# Patient Record
Sex: Male | Born: 1950 | Race: White | Hispanic: No | Marital: Married | Smoking: Never smoker
Health system: Southern US, Community
[De-identification: ages and names within clinical notes are randomized; demographics above are authoritative.]

## PROBLEM LIST (undated history)

## (undated) DIAGNOSIS — I10 Essential (primary) hypertension: Secondary | ICD-10-CM

## (undated) DIAGNOSIS — N200 Calculus of kidney: Secondary | ICD-10-CM

## (undated) DIAGNOSIS — R011 Cardiac murmur, unspecified: Secondary | ICD-10-CM

## (undated) DIAGNOSIS — E785 Hyperlipidemia, unspecified: Secondary | ICD-10-CM

## (undated) DIAGNOSIS — B019 Varicella without complication: Secondary | ICD-10-CM

## (undated) HISTORY — DX: Hyperlipidemia, unspecified: E78.5

## (undated) HISTORY — DX: Cardiac murmur, unspecified: R01.1

## (undated) HISTORY — PX: TONSILLECTOMY: SHX5217

## (undated) HISTORY — DX: Essential (primary) hypertension: I10

## (undated) HISTORY — DX: Calculus of kidney: N20.0

## (undated) HISTORY — DX: Varicella without complication: B01.9

---

## 2009-06-15 ENCOUNTER — Emergency Department (HOSPITAL_COMMUNITY): Admission: EM | Admit: 2009-06-15 | Discharge: 2009-06-15 | Payer: Self-pay | Admitting: Emergency Medicine

## 2010-03-19 IMAGING — CT CT ABDOMEN W/O CM
2 of 4 series · 16 of 46 positions shown, 18 images · non-contrast
Comparison: None.

CT ABDOMEN

CLINICAL DATA: Lower left abdominal and pelvic flank pain .

CT ABDOMEN AND PELVIS WITHOUT CONTRAST
TECHNIQUE: Multidetector CT imaging of the abdomen and pelvis was
performed following the standard protocol without intravenous
contrast.

[Series 2: stone <45 and or <(id) 5.0 mm · axial · 0.66mm/px · z∈[-158,+247]mm · 13 of 89 slices shown, 15 images]
[im 4/89  soft-tissue]
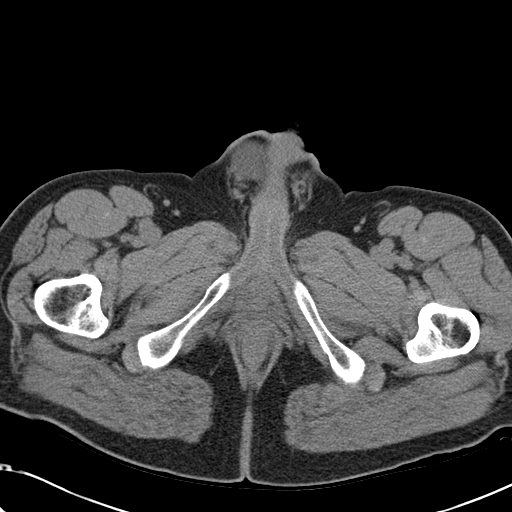
[im 4/89  bone]
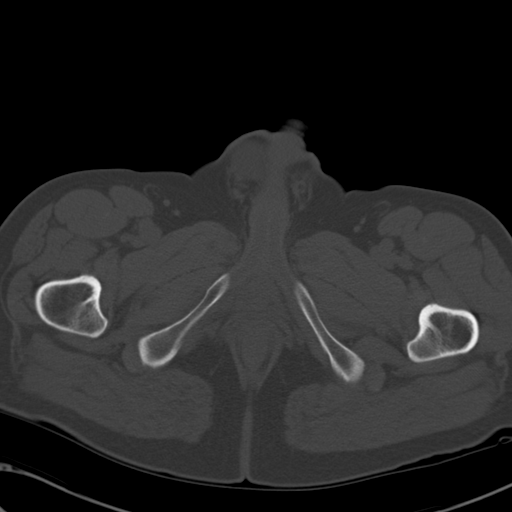
[im 11/89  soft-tissue]
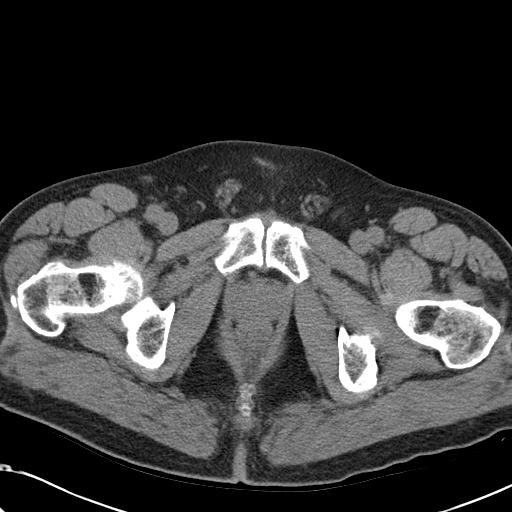
[im 17/89  soft-tissue]
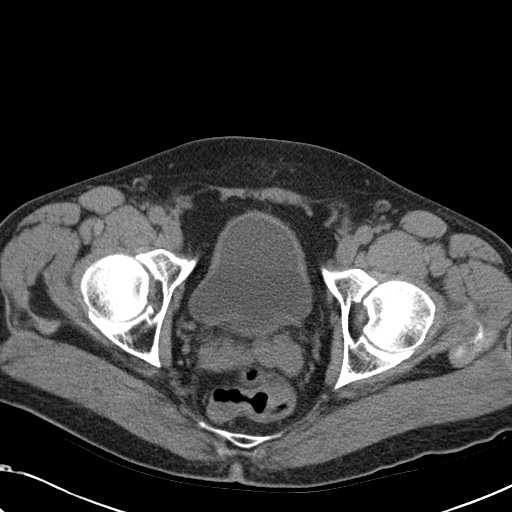
[im 24/89  soft-tissue]
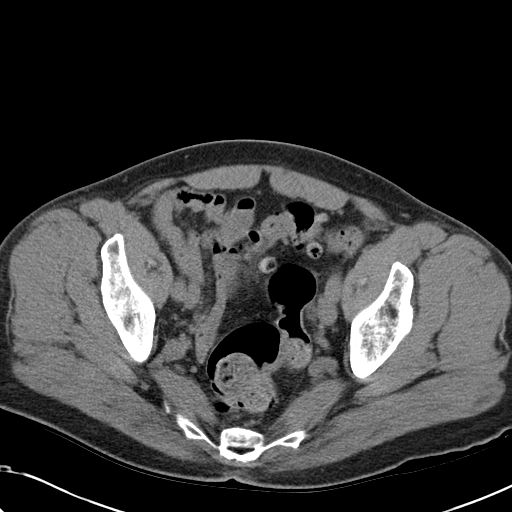
[im 31/89  soft-tissue]
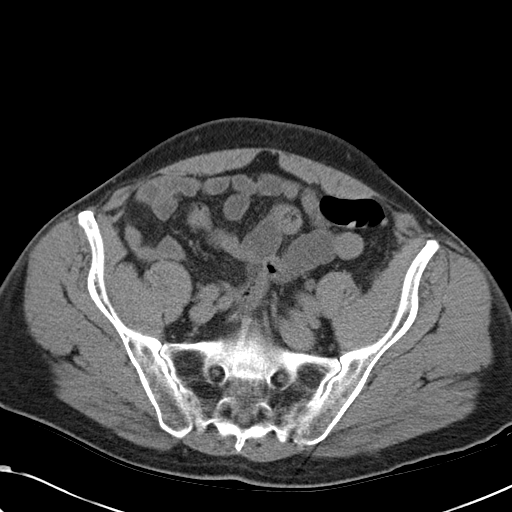
[im 38/89  soft-tissue]
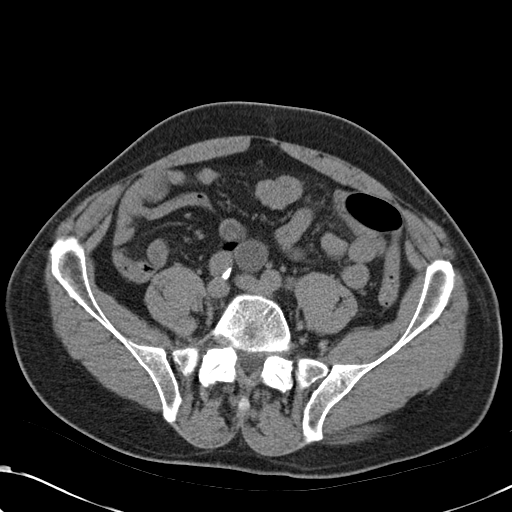
[im 45/89  soft-tissue]
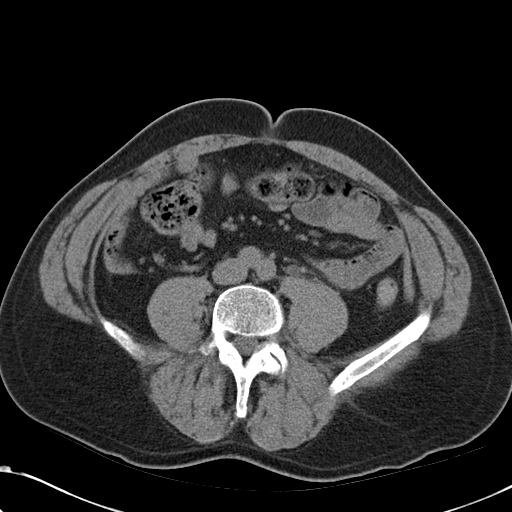
[im 51/89  soft-tissue]
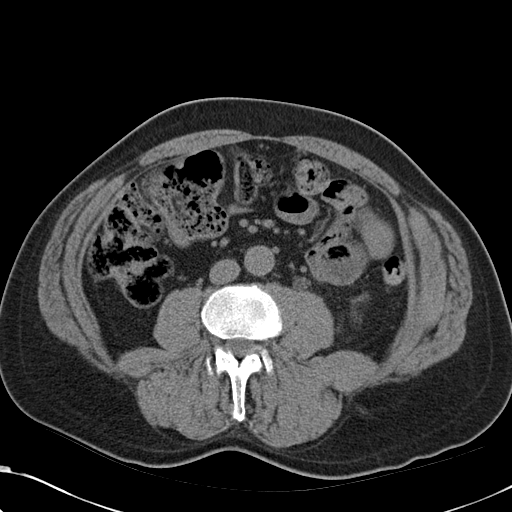
[im 58/89  soft-tissue]
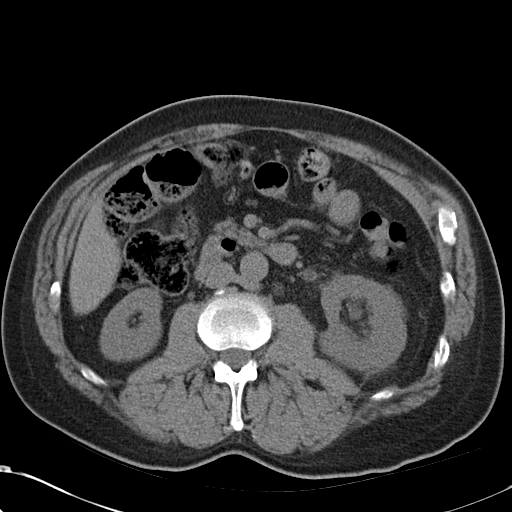
[im 58/89  bone]
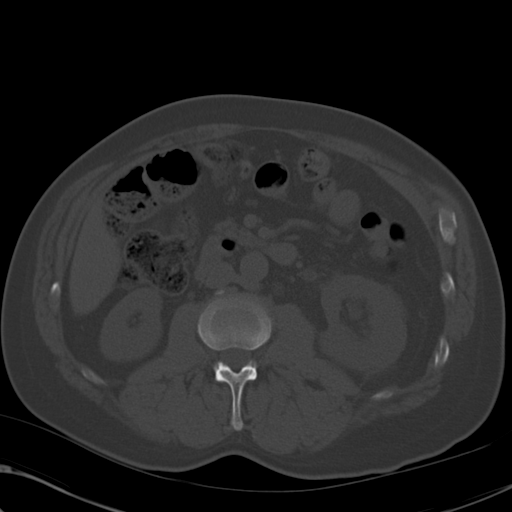
[im 65/89  soft-tissue]
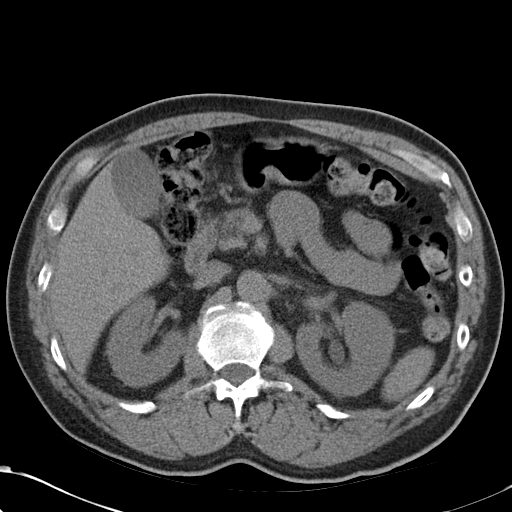
[im 72/89  soft-tissue]
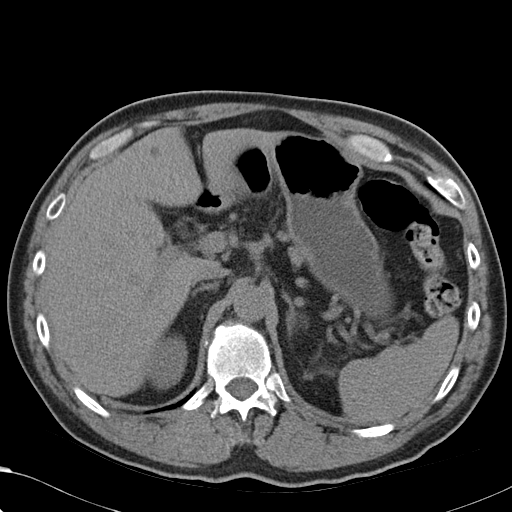
[im 78/89  soft-tissue]
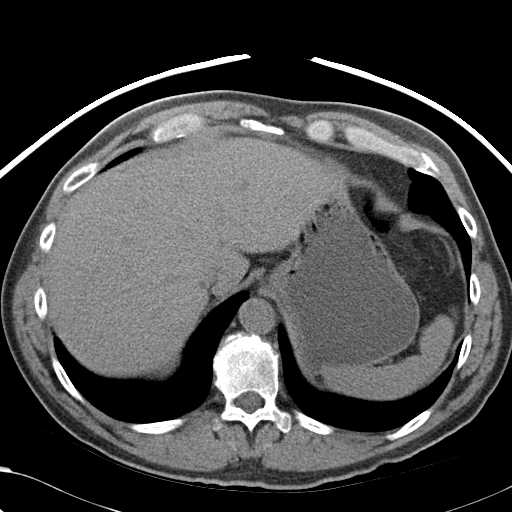
[im 85/89  soft-tissue]
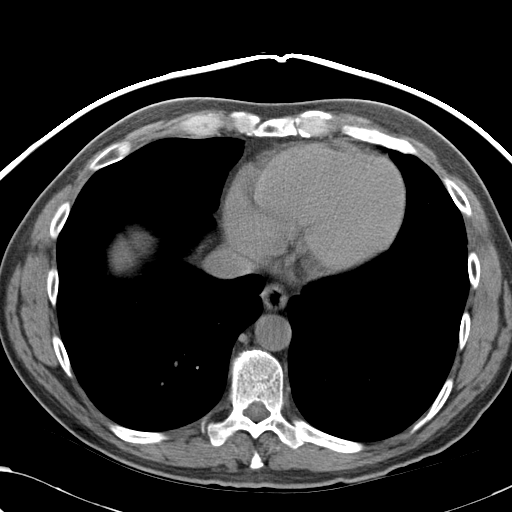

[Series 4: mpr coronal · coronal · 0.65mm/px · 3 of 80 slices shown]
[im 27/80  soft-tissue]
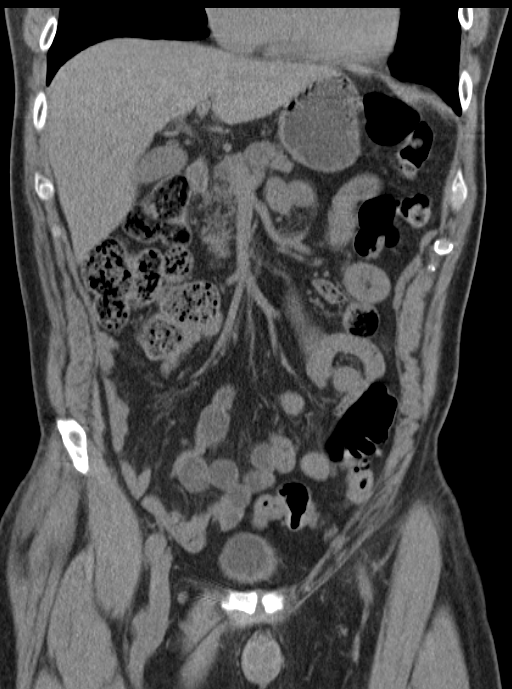
[im 36/80  soft-tissue]
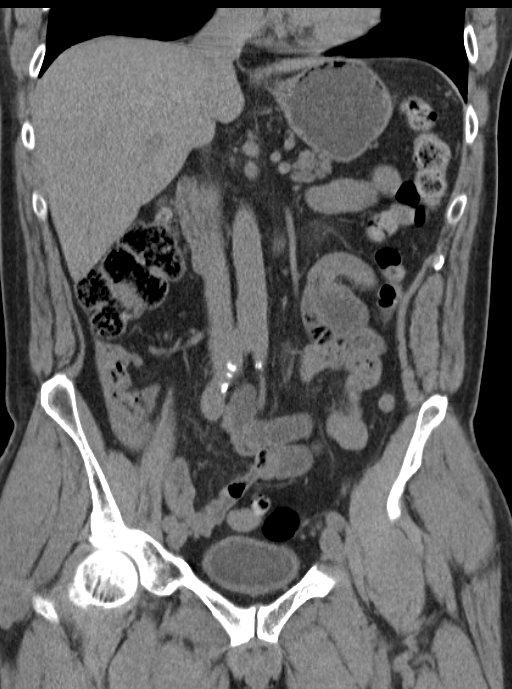
[im 44/80  soft-tissue]
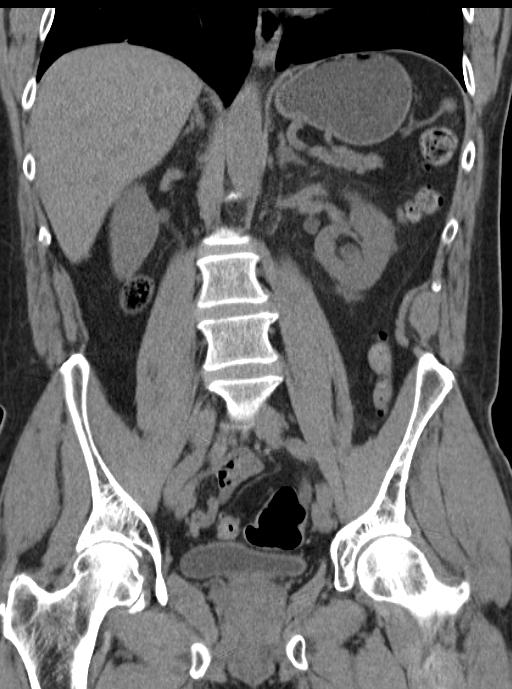

[16 of 46 positions shown; findings below may reference images not displayed]

FINDINGS: Lung bases are clear.  No pericardial or pleural
effusion.  No hiatal hernia.

In the abdomen, the left kidney demonstrates mild acute perinephric
inflammation and hydronephrosis.  Left ureter is also dilated and
inflamed.  This is secondary to a distal left pelvic ureteral
calculus described below.  Right kidney demonstrates no acute
process.  No additional upper urinary tract calculi in the abdomen.

Liver demonstrates a 7 mm focal hypodensity image 18 in the left
hepatic lobe medial segment, nonspecific by noncontrast imaging but
suspect small hepatic cyst.  No intrahepatic biliary dilatation.
The gallbladder, biliary system, adrenal glands, spleen, and
pancreas are within normal limits for noncontrast study.  No acute
bowel obstruction, dilatation, or ileus.  Diverticulosis of the
colon.  No free air
IMPRESSION: Acute mild left hydronephrosis and hydroureter related to a distal
pelvic left ureteral calculus.
Probable small sub centimeter left hepatic cyst
Diverticulosis

CT PELVIS
FINDINGS: Left distal ureter remains dilated as it extends to the
pelvis.  In the left hemi pelvis, there is an obstructing left
ureteral calculus measuring 4.6 mm, image 70.  This is just above
the left UVJ.  Right distal ureter is decompressed and normal.

Diverticulosis also noted of the distal colon.  Terminal ileum and
appendix are normal in the right lower quadrant.  Minimal
atherosclerosis of the iliac vessels.  No inguinal hernia.  Right
hydrocele is noted which is partially imaged.
IMPRESSION: 4.6 mm left distal ureteral obstructing calculus.
Diverticulosis
Right scrotal hydrocele, partially imaged

## 2011-02-28 LAB — URINALYSIS, ROUTINE W REFLEX MICROSCOPIC
Bilirubin Urine: NEGATIVE
Leukocytes, UA: NEGATIVE
Nitrite: NEGATIVE
Specific Gravity, Urine: >1.03 — ABNORMAL HIGH (ref 1.005–1.030)
Urobilinogen, UA: 0.2 mg/dL (ref 0.0–1.0)
pH: 5.5 (ref 5.0–8.0)

## 2011-02-28 LAB — URINE MICROSCOPIC-ADD ON

## 2012-07-07 ENCOUNTER — Encounter: Payer: Self-pay | Admitting: Family Medicine

## 2012-07-07 ENCOUNTER — Ambulatory Visit (INDEPENDENT_AMBULATORY_CARE_PROVIDER_SITE_OTHER): Payer: BC Managed Care – PPO | Admitting: Family Medicine

## 2012-07-07 VITALS — BP 140/80 | Temp 98.2°F | Ht 69.5 in | Wt 188.0 lb

## 2012-07-07 DIAGNOSIS — Z0000 Encounter for general adult medical examination without abnormal findings: Secondary | ICD-10-CM

## 2012-07-07 DIAGNOSIS — Z23 Encounter for immunization: Secondary | ICD-10-CM

## 2012-07-07 LAB — CBC WITH DIFFERENTIAL/PLATELET
Eosinophils Relative: 1.7 % (ref 0.0–5.0)
HCT: 44 % (ref 39.0–52.0)
Hemoglobin: 14.8 g/dL (ref 13.0–17.0)
Lymphs Abs: 1.5 10*3/uL (ref 0.7–4.0)
MCV: 91.3 fl (ref 78.0–100.0)
Monocytes Absolute: 0.5 10*3/uL (ref 0.1–1.0)
Monocytes Relative: 7.6 % (ref 3.0–12.0)
Neutro Abs: 4 10*3/uL (ref 1.4–7.7)
RDW: 13.5 % (ref 11.5–14.6)
WBC: 6.1 10*3/uL (ref 4.5–10.5)

## 2012-07-07 LAB — BASIC METABOLIC PANEL
CO2: 27 mEq/L (ref 19–32)
Calcium: 9.1 mg/dL (ref 8.4–10.5)
Creatinine, Ser: 0.9 mg/dL (ref 0.4–1.5)
GFR: 89.3 mL/min (ref >60.00)
Sodium: 138 mEq/L (ref 135–145)

## 2012-07-07 LAB — LIPID PANEL
HDL: 54.6 mg/dL (ref >39.00)
Total CHOL/HDL Ratio: 4
Triglycerides: 116 mg/dL (ref 0.0–149.0)

## 2012-07-07 LAB — POCT URINALYSIS DIPSTICK
Blood, UA: NEGATIVE
Nitrite, UA: NEGATIVE
Protein, UA: NEGATIVE
Urobilinogen, UA: 0.2
pH, UA: 6

## 2012-07-07 LAB — HEPATIC FUNCTION PANEL
ALT: 20 U/L (ref 0–53)
AST: 19 U/L (ref 0–37)
Albumin: 4.3 g/dL (ref 3.5–5.2)
Alkaline Phosphatase: 49 U/L (ref 39–117)
Total Protein: 6.8 g/dL (ref 6.0–8.3)

## 2012-07-07 NOTE — Progress Notes (Signed)
  Subjective:    Patient ID: Harold White, male    DOB: 12-Feb-1951, 61 y.o.   MRN: [id number]  HPI  Here to establish care. Patient requesting physical. Past history reviewed. Borderline elevated blood pressure. Never treated. Reported history of heart murmur in childhood. One time history of kidney stone. Previous tonsillectomy. Takes no medications other than baby aspirin.  Family history significant for mother with stroke. Dad is 33 and healthy.  Patient is married. No children. Construction supervision. Smoked only briefly in the 1970s. Drinks 2-3 alcoholic beverages per day, usually beer.   darkened spot dorsum left distal middle finger. Present for several years. Unchanged. No history of leg injury.  Fullness under both arms. By description this is fatty tissue and not lymphadenopathy. No appetite or weight changes.  Last tetanus over 10 years ago. No history of screening colonoscopy. No consistent exercise.   Review of Systems  Constitutional: Negative for fever, activity change, appetite change and fatigue.  HENT: Negative for ear pain, congestion and trouble swallowing.   Eyes: Negative for pain and visual disturbance.  Respiratory: Negative for cough, shortness of breath and wheezing.   Cardiovascular: Negative for chest pain and palpitations.  Gastrointestinal: Negative for nausea, vomiting, abdominal pain, diarrhea, constipation, blood in stool, abdominal distention and rectal pain.  Genitourinary: Negative for dysuria, hematuria and testicular pain.  Musculoskeletal: Negative for joint swelling and arthralgias.  Skin: Negative for rash.  Neurological: Negative for dizziness, syncope and headaches.  Hematological: Negative for adenopathy.  Psychiatric/Behavioral: Negative for confusion and dysphoric mood.       Objective:   Physical Exam  Constitutional: He is oriented to person, place, and time. He appears well-developed and well-nourished. No distress.  HENT:    Head: Normocephalic and atraumatic.  Right Ear: External ear normal.  Left Ear: External ear normal.  Mouth/Throat: Oropharynx is clear and moist.  Eyes: Conjunctivae and EOM are normal. Pupils are equal, round, and reactive to light.  Neck: Normal range of motion. Neck supple. No thyromegaly present.  Cardiovascular: Normal rate, regular rhythm and normal heart sounds.   No murmur heard. Pulmonary/Chest: No respiratory distress. He has no wheezes. He has no rales.  Abdominal: Soft. Bowel sounds are normal. He exhibits no distension and no mass. There is no tenderness. There is no rebound and no guarding.  Musculoskeletal: He exhibits no edema.  Lymphadenopathy:    He has no cervical adenopathy.  Neurological: He is alert and oriented to person, place, and time. He displays normal reflexes. No cranial nerve deficit.  Skin: No rash noted.       Left middle finger between PIP and DIP joint small darkened area below the epidermis. Approximately 1 mm diameter. No rash  Psychiatric: He has a normal mood and affect.          Assessment & Plan:  #1 health maintenance. Tetanus booster given. Hemoccult cards given. Patient undecided regarding colonoscopy this time. Establish more consistent exercise. Obtain screening lab work #2 darkened spot left middle finger. This appears to be probably lead deposit. This is not epidermal. Reassurance  #3 fatty tissue axillary and chest wall region. No evidence for adenopathy. This is benign fatty tissue. Reassurance

## 2012-07-07 NOTE — Patient Instructions (Addendum)
Consider colonoscopy at some point this year. 

## 2012-07-12 NOTE — Progress Notes (Signed)
Quick Note:  Called and spoke with pt and pt is aware of lab results. Verfied address and labs sent. ______

## 2013-07-13 ENCOUNTER — Encounter: Payer: BC Managed Care – PPO | Admitting: Family Medicine

## 2013-07-13 ENCOUNTER — Other Ambulatory Visit (INDEPENDENT_AMBULATORY_CARE_PROVIDER_SITE_OTHER): Payer: BC Managed Care – PPO

## 2013-07-13 DIAGNOSIS — Z0000 Encounter for general adult medical examination without abnormal findings: Secondary | ICD-10-CM

## 2013-07-13 LAB — POCT URINALYSIS DIPSTICK
Glucose, UA: NEGATIVE
Ketones, UA: NEGATIVE
Protein, UA: NEGATIVE
Spec Grav, UA: 1.01

## 2013-07-13 LAB — CBC WITH DIFFERENTIAL/PLATELET
Basophils Absolute: 0 10*3/uL (ref 0.0–0.1)
Basophils Relative: 0.5 % (ref 0.0–3.0)
Eosinophils Absolute: 0.1 10*3/uL (ref 0.0–0.7)
MCHC: 34.4 g/dL (ref 30.0–36.0)
MCV: 89.5 fl (ref 78.0–100.0)
Monocytes Absolute: 0.4 10*3/uL (ref 0.1–1.0)
Neutrophils Relative %: 48.5 % (ref 43.0–77.0)
Platelets: 126 10*3/uL — ABNORMAL LOW (ref 150.0–400.0)
RBC: 4.8 Mil/uL (ref 4.22–5.81)
RDW: 13.9 % (ref 11.5–14.6)

## 2013-07-13 LAB — LIPID PANEL
Cholesterol: 198 mg/dL (ref 0–200)
VLDL: 13.8 mg/dL (ref 0.0–40.0)

## 2013-07-13 LAB — PSA: PSA: 0.48 ng/mL (ref 0.10–4.00)

## 2013-07-13 LAB — HEPATIC FUNCTION PANEL
Alkaline Phosphatase: 42 U/L (ref 39–117)
Bilirubin, Direct: 0.1 mg/dL (ref 0.0–0.3)

## 2013-07-13 LAB — BASIC METABOLIC PANEL
BUN: 14 mg/dL (ref 6–23)
GFR: 82.74 mL/min (ref >60.00)
Potassium: 4.3 mEq/L (ref 3.5–5.1)

## 2013-07-13 LAB — TSH: TSH: 2 u[IU]/mL (ref 0.35–5.50)

## 2013-07-20 ENCOUNTER — Encounter: Payer: Self-pay | Admitting: Family Medicine

## 2013-07-20 ENCOUNTER — Ambulatory Visit (INDEPENDENT_AMBULATORY_CARE_PROVIDER_SITE_OTHER): Payer: BC Managed Care – PPO | Admitting: Family Medicine

## 2013-07-20 VITALS — BP 140/78 | HR 63 | Temp 97.5°F | Wt 186.0 lb

## 2013-07-20 DIAGNOSIS — Z0000 Encounter for general adult medical examination without abnormal findings: Secondary | ICD-10-CM

## 2013-07-20 DIAGNOSIS — R319 Hematuria, unspecified: Secondary | ICD-10-CM

## 2013-07-20 LAB — POCT URINALYSIS DIPSTICK
Bilirubin, UA: NEGATIVE
Blood, UA: NEGATIVE
Glucose, UA: NEGATIVE
Spec Grav, UA: <=1.005

## 2013-07-20 NOTE — Patient Instructions (Addendum)
Shingles Vaccine What You Need to Know WHAT IS SHINGLES?  Shingles is a painful skin rash, often with blisters. It is also called Herpes Zoster or just Zoster.  A shingles rash usually appears on one side of the face or body and lasts from 2 to 4 weeks. Its main symptom is pain, which can be quite severe. Other symptoms of shingles can include fever, headache, chills, and upset stomach. Very rarely, a shingles infection can lead to pneumonia, hearing problems, blindness, brain inflammation (encephalitis), or death.  For about 1 person in 5, severe pain can continue even after the rash clears up. This is called post-herpetic neuralgia.  Shingles is caused by the Varicella Zoster virus. This is the same virus that causes chickenpox. Only someone who has had a case of chickenpox or rarely, has gotten chickenpox vaccine, can get shingles. The virus stays in your body. It can reappear many years later to cause a case of shingles.  You cannot catch shingles from another person with shingles. However, a person who has never had chickenpox (or chickenpox vaccine) could get chickenpox from someone with shingles. This is not very common.  Shingles is far more common in people 50 and older than in younger people. It is also more common in people whose immune systems are weakened because of a disease such as cancer or drugs such as steroids or chemotherapy.  At least 1 million people get shingles per year in the [place]. SHINGLES VACCINE  A vaccine for shingles was licensed in 2006. In clinical trials, the vaccine reduced the risk of shingles by 50%. It can also reduce the pain in people who still get shingles after being vaccinated.  A single dose of shingles vaccine is recommended for adults 60 years of age and older. SOME PEOPLE SHOULD NOT GET SHINGLES VACCINE OR SHOULD WAIT A person should not get shingles vaccine if he or she:  Has ever had a life-threatening allergic reaction to gelatin, the  antibiotic neomycin, or any other component of shingles vaccine. Tell your caregiver if you have any severe allergies.  Has a weakened immune system because of current:  AIDS or another disease that affects the immune system.  Treatment with drugs that affect the immune system, such as prolonged use of high-dose steroids.  Cancer treatment, such as radiation or chemotherapy.  Cancer affecting the bone marrow or lymphatic system, such as leukemia or lymphoma.  Is pregnant, or might be pregnant. Women should not become pregnant until at least 4 weeks after getting shingles vaccine. Someone with a minor illness, such as a cold, may be vaccinated. Anyone with a moderate or severe acute illness should usually wait until he or she recovers before getting the vaccine. This includes anyone with a temperature of 101.3 F (38 C) or higher. WHAT ARE THE RISKS FROM SHINGLES VACCINE?  A vaccine, like any medicine, could possibly cause serious problems, such as severe allergic reactions. However, the risk of a vaccine causing serious harm, or death, is extremely small.  No serious problems have been identified with shingles vaccine. Mild Problems  Redness, soreness, swelling, or itching at the site of the injection (about 1 person in 3).  Headache (about 1 person in 70). Like all vaccines, shingles vaccine is being closely monitored for unusual or severe problems. WHAT IF THERE IS A MODERATE OR SEVERE REACTION? What should I look for? Any unusual condition, such as a severe allergic reaction or a high fever. If a severe allergic reaction   occurred, it would be within a few minutes to an hour after the shot. Signs of a serious allergic reaction can include difficulty breathing, weakness, hoarseness or wheezing, a fast heartbeat, hives, dizziness, paleness, or swelling of the throat. What should I do?  Call your caregiver, or get the person to a caregiver right away.  Tell the caregiver what  happened, the date and time it happened, and when the vaccination was given.  Ask the caregiver to report the reaction by filing a Vaccine Adverse Event Reporting System (VAERS) form. Or, you can file this report through the VAERS web site at www.vaers.LAgents.no or by calling 1-701-562-1319. VAERS does not provide medical advice. HOW CAN I LEARN MORE?  Ask your caregiver. He or she can give you the vaccine package insert or suggest other sources of information.  Contact the Centers for Disease Control and Prevention (CDC):  Call (717) 015-1185 (1-800-CDC-INFO).  Visit the CDC website at PicCapture.uy CDC Shingles Vaccine VIS (08/27/08) Document Released: 09/05/2006 Document Revised: 01/31/2012 Document Reviewed: 08/27/2008 ExitCare Patient Information 2014 [place], [place].  Consider check with insurance company regarding shingles vaccine and let us know if you're interested Let me know if you change your mind regarding setting up colonoscopy

## 2013-07-20 NOTE — Progress Notes (Signed)
  Subjective:    Patient ID: Harold White, male    DOB: 1950-12-29, 62 y.o.   MRN: [id number]  HPI Patient here for complete physical.  Generally very healthy. Takes no medications. Tetanus given last year. No history of shingles vaccine. He does have history of prediabetes by previous labs. Weight is stable. Nonsmoker. Drinks about 2-3 beers per day. He has never had screening colonoscopy and he does not consent for going at this time. He declines flu vaccine.  Past Medical History  Diagnosis Date  . Chicken pox   . Heart murmur   . Hypertension   . Kidney stones    Past Surgical History  Procedure Laterality Date  . Tonsillectomy      195    reports that he has never smoked. He does not have any smokeless tobacco history on file. He reports that  drinks alcohol. His drug history is not on file. family history includes Stroke in his mother. No Known Allergies    Review of Systems  Constitutional: Negative for fever, activity change, appetite change and fatigue.  HENT: Negative for ear pain, congestion and trouble swallowing.   Eyes: Negative for pain and visual disturbance.  Respiratory: Negative for cough, shortness of breath and wheezing.   Cardiovascular: Negative for chest pain and palpitations.  Gastrointestinal: Negative for nausea, vomiting, abdominal pain, diarrhea, constipation, blood in stool, abdominal distention and rectal pain.  Genitourinary: Negative for dysuria, hematuria and testicular pain.  Musculoskeletal: Negative for joint swelling and arthralgias.  Skin: Negative for rash.  Neurological: Negative for dizziness, syncope and headaches.  Hematological: Negative for adenopathy.  Psychiatric/Behavioral: Negative for confusion and dysphoric mood.       Objective:   Physical Exam  Constitutional: He is oriented to person, place, and time. He appears well-developed and well-nourished. No distress.  HENT:  Head: Normocephalic and atraumatic.  Right Ear:  External ear normal.  Left Ear: External ear normal.  Mouth/Throat: Oropharynx is clear and moist.  Eyes: Conjunctivae and EOM are normal. Pupils are equal, round, and reactive to light.  Neck: Normal range of motion. Neck supple. No thyromegaly present.  Cardiovascular: Normal rate, regular rhythm and normal heart sounds.   No murmur heard. Pulmonary/Chest: No respiratory distress. He has no wheezes. He has no rales.  Abdominal: Soft. Bowel sounds are normal. He exhibits no distension and no mass. There is no tenderness. There is no rebound and no guarding.  Genitourinary: Rectum normal and prostate normal.  Musculoskeletal: He exhibits no edema.  Lymphadenopathy:    He has no cervical adenopathy.  Neurological: He is alert and oriented to person, place, and time. He displays normal reflexes. No cranial nerve deficit.  Skin: No rash noted.  Psychiatric: He has a normal mood and affect.          Assessment & Plan:  Complete physical. Labs reviewed. Trace blood on urine dipstick. Repeat urine dipstick. If still positive, send for urine microscopy. Recommend screening colonoscopy and he declines. He declines flu vaccine. Tetanus update. Consider shingles vaccine and he will check with insurance.

## 2014-08-02 ENCOUNTER — Other Ambulatory Visit (INDEPENDENT_AMBULATORY_CARE_PROVIDER_SITE_OTHER): Payer: BC Managed Care – PPO

## 2014-08-02 DIAGNOSIS — Z0000 Encounter for general adult medical examination without abnormal findings: Secondary | ICD-10-CM

## 2014-08-02 LAB — CBC WITH DIFFERENTIAL/PLATELET
BASOS ABS: 0 10*3/uL (ref 0.0–0.1)
BASOS PCT: 0.4 % (ref 0.0–3.0)
EOS ABS: 0.2 10*3/uL (ref 0.0–0.7)
Eosinophils Relative: 3 % (ref 0.0–5.0)
HCT: 44.8 % (ref 39.0–52.0)
HEMOGLOBIN: 15.2 g/dL (ref 13.0–17.0)
LYMPHS PCT: 40.3 % (ref 12.0–46.0)
Lymphs Abs: 2.3 10*3/uL (ref 0.7–4.0)
MCHC: 33.9 g/dL (ref 30.0–36.0)
MCV: 90.4 fl (ref 78.0–100.0)
MONOS PCT: 8.2 % (ref 3.0–12.0)
Monocytes Absolute: 0.5 10*3/uL (ref 0.1–1.0)
NEUTROS PCT: 48.1 % (ref 43.0–77.0)
Neutro Abs: 2.7 10*3/uL (ref 1.4–7.7)
Platelets: 142 10*3/uL — ABNORMAL LOW (ref 150.0–400.0)
RBC: 4.95 Mil/uL (ref 4.22–5.81)
RDW: 13.5 % (ref 11.5–15.5)
WBC: 5.7 10*3/uL (ref 4.0–10.5)

## 2014-08-02 LAB — BASIC METABOLIC PANEL
BUN: 13 mg/dL (ref 6–23)
CALCIUM: 9.2 mg/dL (ref 8.4–10.5)
CO2: 28 meq/L (ref 19–32)
CREATININE: 1 mg/dL (ref 0.4–1.5)
Chloride: 105 mEq/L (ref 96–112)
GFR: 78.73 mL/min (ref >60.00)
Glucose, Bld: 105 mg/dL — ABNORMAL HIGH (ref 70–99)
Potassium: 4.1 mEq/L (ref 3.5–5.1)
SODIUM: 140 meq/L (ref 135–145)

## 2014-08-02 LAB — POCT URINALYSIS DIPSTICK
BILIRUBIN UA: NEGATIVE
Blood, UA: NEGATIVE
Glucose, UA: NEGATIVE
KETONES UA: NEGATIVE
LEUKOCYTES UA: NEGATIVE
Nitrite, UA: NEGATIVE
Protein, UA: NEGATIVE
SPEC GRAV UA: 1.01
Urobilinogen, UA: 0.2
pH, UA: 7.5

## 2014-08-02 LAB — HEPATIC FUNCTION PANEL
ALK PHOS: 48 U/L (ref 39–117)
ALT: 25 U/L (ref 0–53)
AST: 19 U/L (ref 0–37)
Albumin: 4.1 g/dL (ref 3.5–5.2)
BILIRUBIN DIRECT: 0.1 mg/dL (ref 0.0–0.3)
TOTAL PROTEIN: 6.9 g/dL (ref 6.0–8.3)
Total Bilirubin: 0.8 mg/dL (ref 0.2–1.2)

## 2014-08-02 LAB — LIPID PANEL
CHOLESTEROL: 225 mg/dL — AB (ref 0–200)
HDL: 57.3 mg/dL (ref >39.00)
LDL Cholesterol: 154 mg/dL — ABNORMAL HIGH (ref 0–99)
NonHDL: 167.7
Total CHOL/HDL Ratio: 4
Triglycerides: 71 mg/dL (ref 0.0–149.0)
VLDL: 14.2 mg/dL (ref 0.0–40.0)

## 2014-08-02 LAB — PSA: PSA: 0.54 ng/mL (ref 0.10–4.00)

## 2014-08-02 LAB — TSH: TSH: 2.63 u[IU]/mL (ref 0.35–4.50)

## 2014-08-08 ENCOUNTER — Ambulatory Visit (INDEPENDENT_AMBULATORY_CARE_PROVIDER_SITE_OTHER): Payer: BC Managed Care – PPO | Admitting: Family Medicine

## 2014-08-08 ENCOUNTER — Encounter: Payer: Self-pay | Admitting: Family Medicine

## 2014-08-08 VITALS — BP 138/82 | HR 66 | Temp 98.2°F | Ht 69.0 in | Wt 188.0 lb

## 2014-08-08 DIAGNOSIS — Z23 Encounter for immunization: Secondary | ICD-10-CM

## 2014-08-08 DIAGNOSIS — Z0000 Encounter for general adult medical examination without abnormal findings: Secondary | ICD-10-CM

## 2014-08-08 NOTE — Progress Notes (Signed)
Pre visit review using our clinic review tool, if applicable. No additional management support is needed unless otherwise documented below in the visit note. 

## 2014-08-08 NOTE — Progress Notes (Signed)
   Subjective:    Patient ID: Harold White, male    DOB: [birth year], 63 y.o.   MRN: [id number]  HPI  Patient seen for complete physical. He takes no regular medications. He has declined colonoscopy in the past. He declines shingles vaccine. Tetanus update. Needs flu vaccine. No consistent exercise. Nonsmoker. Poor compliance with diet over the past year.  Past Medical History  Diagnosis Date  . Chicken pox   . Heart murmur   . Hypertension   . Kidney stones   . Hyperlipidemia    Past Surgical History  Procedure Laterality Date  . Tonsillectomy      195    reports that he has never smoked. He does not have any smokeless tobacco history on file. He reports that he drinks alcohol. His drug history is not on file. family history includes Stroke in his mother. No Known Allergies    Review of Systems  Constitutional: Negative for fever, activity change, appetite change and fatigue.  HENT: Negative for congestion, ear pain and trouble swallowing.   Eyes: Negative for pain and visual disturbance.  Respiratory: Negative for cough, shortness of breath and wheezing.   Cardiovascular: Negative for chest pain and palpitations.  Gastrointestinal: Negative for nausea, vomiting, abdominal pain, diarrhea, constipation, blood in stool, abdominal distention and rectal pain.  Endocrine: Negative for polydipsia and polyuria.  Genitourinary: Negative for dysuria, hematuria and testicular pain.  Musculoskeletal: Negative for arthralgias and joint swelling.  Skin: Negative for rash.  Neurological: Negative for dizziness, syncope and headaches.  Hematological: Negative for adenopathy.  Psychiatric/Behavioral: Negative for confusion and dysphoric mood.       Objective:   Physical Exam  Constitutional: He is oriented to person, place, and time. He appears well-developed and well-nourished. No distress.  HENT:  Head: Normocephalic and atraumatic.  Right Ear: External ear normal.  Left Ear:  External ear normal.  Mouth/Throat: Oropharynx is clear and moist.  Eyes: Conjunctivae and EOM are normal. Pupils are equal, round, and reactive to light.  Neck: Normal range of motion. Neck supple. No thyromegaly present.  Cardiovascular: Normal rate, regular rhythm and normal heart sounds.   No murmur heard. Pulmonary/Chest: No respiratory distress. He has no wheezes. He has no rales.  Abdominal: Soft. Bowel sounds are normal. He exhibits no distension and no mass. There is no tenderness. There is no rebound and no guarding.  Musculoskeletal: He exhibits no edema.  Lymphadenopathy:    He has no cervical adenopathy.  Neurological: He is alert and oriented to person, place, and time. He displays normal reflexes. No cranial nerve deficit.  Skin: No rash noted.  Psychiatric: He has a normal mood and affect.          Assessment & Plan:  Complete physical. We discussed colonoscopy screening and he is not willing to schedule at this point. He declines Hemoccult cards. We discussed pros and cons of shingles vaccine and he is undecided at this point. He does agree to flu vaccine. Labs reviewed. Elevated LDL cholesterol 154. Patient prefers dietary modification and repeat after 6 months or one year. Reduce alcohol consumption. He is encouraged to lose some weight.

## 2014-08-08 NOTE — Patient Instructions (Signed)
Fat and Cholesterol Control Diet Fat and cholesterol levels in your blood and organs are influenced by your diet. High levels of fat and cholesterol may lead to diseases of the heart, small and large blood vessels, gallbladder, liver, and pancreas. CONTROLLING FAT AND CHOLESTEROL WITH DIET Although exercise and lifestyle factors are important, your diet is key. That is because certain foods are known to raise cholesterol and others to lower it. The goal is to balance foods for their effect on cholesterol and more importantly, to replace saturated and trans fat with other types of fat, such as monounsaturated fat, polyunsaturated fat, and omega-3 fatty acids. On average, a person should consume no more than 15 to 17 g of saturated fat daily. Saturated and trans fats are considered "bad" fats, and they will raise LDL cholesterol. Saturated fats are primarily found in animal products such as meats, butter, and cream. However, that does not mean you need to give up all your favorite foods. Today, there are good tasting, low-fat, low-cholesterol substitutes for most of the things you like to eat. Choose low-fat or nonfat alternatives. Choose round or loin cuts of red meat. These types of cuts are lowest in fat and cholesterol. Chicken (without the skin), fish, veal, and ground turkey breast are great choices. Eliminate fatty meats, such as hot dogs and salami. Even shellfish have little or no saturated fat. Have a 3 oz (85 g) portion when you eat lean meat, poultry, or fish. Trans fats are also called "partially hydrogenated oils." They are oils that have been scientifically manipulated so that they are solid at room temperature resulting in a longer shelf life and improved taste and texture of foods in which they are added. Trans fats are found in stick margarine, some tub margarines, cookies, crackers, and baked goods.  When baking and cooking, oils are a great substitute for butter. The monounsaturated oils are  especially beneficial since it is believed they lower LDL and raise HDL. The oils you should avoid entirely are saturated tropical oils, such as coconut and palm.  Remember to eat a lot from food groups that are naturally free of saturated and trans fat, including fish, fruit, vegetables, beans, grains (barley, rice, couscous, bulgur wheat), and pasta (without cream sauces).  IDENTIFYING FOODS THAT LOWER FAT AND CHOLESTEROL  Soluble fiber may lower your cholesterol. This type of fiber is found in fruits such as apples, vegetables such as broccoli, potatoes, and carrots, legumes such as beans, peas, and lentils, and grains such as barley. Foods fortified with plant sterols (phytosterol) may also lower cholesterol. You should eat at least 2 g per day of these foods for a cholesterol lowering effect.  Read package labels to identify low-saturated fats, trans fat free, and low-fat foods at the supermarket. Select cheeses that have only 2 to 3 g saturated fat per ounce. Use a heart-healthy tub margarine that is free of trans fats or partially hydrogenated oil. When buying baked goods (cookies, crackers), avoid partially hydrogenated oils. Breads and muffins should be made from whole grains (whole-wheat or whole oat flour, instead of "flour" or "enriched flour"). Buy non-creamy canned soups with reduced salt and no added fats.  FOOD PREPARATION TECHNIQUES  Never deep-fry. If you must fry, either stir-fry, which uses very little fat, or use non-stick cooking sprays. When possible, broil, bake, or roast meats, and steam vegetables. Instead of putting butter or margarine on vegetables, use lemon and herbs, applesauce, and cinnamon (for squash and sweet potatoes). Use nonfat   yogurt, salsa, and low-fat dressings for salads.  LOW-SATURATED FAT / LOW-FAT FOOD SUBSTITUTES Meats / Saturated Fat (g)  Avoid: Steak, marbled (3 oz/85 g) / 11 g  Choose: Steak, lean (3 oz/85 g) / 4 g  Avoid: Hamburger (3 oz/85 g) / 7  g  Choose: Hamburger, lean (3 oz/85 g) / 5 g  Avoid: Ham (3 oz/85 g) / 6 g  Choose: Ham, lean cut (3 oz/85 g) / 2.4 g  Avoid: Chicken, with skin, dark meat (3 oz/85 g) / 4 g  Choose: Chicken, skin removed, dark meat (3 oz/85 g) / 2 g  Avoid: Chicken, with skin, light meat (3 oz/85 g) / 2.5 g  Choose: Chicken, skin removed, light meat (3 oz/85 g) / 1 g Dairy / Saturated Fat (g)  Avoid: Whole milk (1 cup) / 5 g  Choose: Low-fat milk, 2% (1 cup) / 3 g  Choose: Low-fat milk, 1% (1 cup) / 1.5 g  Choose: Skim milk (1 cup) / 0.3 g  Avoid: Hard cheese (1 oz/28 g) / 6 g  Choose: Skim milk cheese (1 oz/28 g) / 2 to 3 g  Avoid: Cottage cheese, 4% fat (1 cup) / 6.5 g  Choose: Low-fat cottage cheese, 1% fat (1 cup) / 1.5 g  Avoid: Ice cream (1 cup) / 9 g  Choose: Sherbet (1 cup) / 2.5 g  Choose: Nonfat frozen yogurt (1 cup) / 0.3 g  Choose: Frozen fruit bar / trace  Avoid: Whipped cream (1 tbs) / 3.5 g  Choose: Nondairy whipped topping (1 tbs) / 1 g Condiments / Saturated Fat (g)  Avoid: Mayonnaise (1 tbs) / 2 g  Choose: Low-fat mayonnaise (1 tbs) / 1 g  Avoid: Butter (1 tbs) / 7 g  Choose: Extra light margarine (1 tbs) / 1 g  Avoid: Coconut oil (1 tbs) / 11.8 g  Choose: Olive oil (1 tbs) / 1.8 g  Choose: Corn oil (1 tbs) / 1.7 g  Choose: Safflower oil (1 tbs) / 1.2 g  Choose: Sunflower oil (1 tbs) / 1.4 g  Choose: Soybean oil (1 tbs) / 2.4 g  Choose: Canola oil (1 tbs) / 1 g Document Released: 11/08/2005 Document Revised: 03/05/2013 Document Reviewed: 02/06/2014 ExitCare Patient Information 2015 [place], [place]. This information is not intended to replace advice given to you by your health care provider. Make sure you discuss any questions you have with your health care provider.  Let me know if you decide to pursue shingles or colonoscopy.

## 2016-01-02 ENCOUNTER — Other Ambulatory Visit (INDEPENDENT_AMBULATORY_CARE_PROVIDER_SITE_OTHER): Payer: BLUE CROSS/BLUE SHIELD

## 2016-01-02 DIAGNOSIS — Z0000 Encounter for general adult medical examination without abnormal findings: Secondary | ICD-10-CM

## 2016-01-02 LAB — BASIC METABOLIC PANEL
BUN: 15 mg/dL (ref 6–23)
CALCIUM: 9.2 mg/dL (ref 8.4–10.5)
CO2: 30 mEq/L (ref 19–32)
Chloride: 104 mEq/L (ref 96–112)
Creatinine, Ser: 1.06 mg/dL (ref 0.40–1.50)
GFR: 74.97 mL/min (ref >60.00)
GLUCOSE: 110 mg/dL — AB (ref 70–99)
POTASSIUM: 4.3 meq/L (ref 3.5–5.1)
SODIUM: 139 meq/L (ref 135–145)

## 2016-01-02 LAB — PSA: PSA: 0.47 ng/mL (ref 0.10–4.00)

## 2016-01-02 LAB — LIPID PANEL
CHOL/HDL RATIO: 4
Cholesterol: 202 mg/dL — ABNORMAL HIGH (ref 0–200)
HDL: 56.7 mg/dL (ref >39.00)
LDL CALC: 134 mg/dL — AB (ref 0–99)
NonHDL: 145.36
TRIGLYCERIDES: 57 mg/dL (ref 0.0–149.0)
VLDL: 11.4 mg/dL (ref 0.0–40.0)

## 2016-01-02 LAB — HEPATIC FUNCTION PANEL
ALBUMIN: 4.3 g/dL (ref 3.5–5.2)
ALK PHOS: 48 U/L (ref 39–117)
ALT: 23 U/L (ref 0–53)
AST: 19 U/L (ref 0–37)
BILIRUBIN TOTAL: 0.9 mg/dL (ref 0.2–1.2)
Bilirubin, Direct: 0.1 mg/dL (ref 0.0–0.3)
Total Protein: 6.9 g/dL (ref 6.0–8.3)

## 2016-01-02 LAB — TSH: TSH: 1.84 u[IU]/mL (ref 0.35–4.50)

## 2016-01-03 LAB — CBC WITH DIFFERENTIAL/PLATELET
BASOS ABS: 0 10*3/uL (ref 0.0–0.1)
Basophils Relative: 0.4 % (ref 0.0–3.0)
Eosinophils Absolute: 0.1 10*3/uL (ref 0.0–0.7)
Eosinophils Relative: 2.4 % (ref 0.0–5.0)
HCT: 46.8 % (ref 39.0–52.0)
HEMOGLOBIN: 15.1 g/dL (ref 13.0–17.0)
LYMPHS ABS: 2.2 10*3/uL (ref 0.7–4.0)
Lymphocytes Relative: 40.6 % (ref 12.0–46.0)
MCHC: 32.3 g/dL (ref 30.0–36.0)
MCV: 93.8 fl (ref 78.0–100.0)
MONOS PCT: 8.8 % (ref 3.0–12.0)
Monocytes Absolute: 0.5 10*3/uL (ref 0.1–1.0)
NEUTROS PCT: 47.8 % (ref 43.0–77.0)
Neutro Abs: 2.6 10*3/uL (ref 1.4–7.7)
Platelets: 168 10*3/uL (ref 150.0–400.0)
RBC: 4.99 Mil/uL (ref 4.22–5.81)
RDW: 13.6 % (ref 11.5–15.5)
WBC: 5.4 10*3/uL (ref 4.0–10.5)

## 2016-01-09 ENCOUNTER — Encounter: Payer: Self-pay | Admitting: Family Medicine

## 2016-01-09 ENCOUNTER — Ambulatory Visit (INDEPENDENT_AMBULATORY_CARE_PROVIDER_SITE_OTHER): Payer: BLUE CROSS/BLUE SHIELD | Admitting: Family Medicine

## 2016-01-09 VITALS — BP 154/88 | HR 60 | Temp 97.5°F | Ht 69.0 in | Wt 187.0 lb

## 2016-01-09 DIAGNOSIS — R7303 Prediabetes: Secondary | ICD-10-CM | POA: Diagnosis not present

## 2016-01-09 DIAGNOSIS — Z0000 Encounter for general adult medical examination without abnormal findings: Secondary | ICD-10-CM | POA: Diagnosis not present

## 2016-01-09 DIAGNOSIS — Z23 Encounter for immunization: Secondary | ICD-10-CM | POA: Diagnosis not present

## 2016-01-09 NOTE — Patient Instructions (Addendum)
Monitor blood pressure and be in touch if consistently > 150/90 Scale back beer consumption.   Check on coverage for shingles vaccine- if interested.     DASH Eating Plan DASH stands for "Dietary Approaches to Stop Hypertension." The DASH eating plan is a healthy eating plan that has been shown to reduce high blood pressure (hypertension). Additional health benefits may include reducing the risk of type 2 diabetes mellitus, heart disease, and stroke. The DASH eating plan may also help with weight loss. WHAT DO I NEED TO KNOW ABOUT THE DASH EATING PLAN? For the DASH eating plan, you will follow these general guidelines:  Choose foods with a percent daily value for sodium of less than 5% (as listed on the food label).  Use salt-free seasonings or herbs instead of table salt or sea salt.  Check with your health care provider or pharmacist before using salt substitutes.  Eat lower-sodium products, often labeled as "lower sodium" or "no salt added."  Eat fresh foods.  Eat more vegetables, fruits, and low-fat dairy products.  Choose whole grains. Look for the word "whole" as the first word in the ingredient list.  Choose fish and skinless chicken or [place] more often than red meat. Limit fish, poultry, and meat to 6 oz (170 g) each day.  Limit sweets, desserts, sugars, and sugary drinks.  Choose heart-healthy fats.  Limit cheese to 1 oz (28 g) per day.  Eat more home-cooked food and less restaurant, buffet, and fast food.  Limit fried foods.  Cook foods using methods other than frying.  Limit canned vegetables. If you do use them, rinse them well to decrease the sodium.  When eating at a restaurant, ask that your food be prepared with less salt, or no salt if possible. WHAT FOODS CAN I EAT? Seek help from a dietitian for individual calorie needs. Grains Whole grain or whole wheat bread. Brown rice. Whole grain or whole wheat pasta. Quinoa, bulgur, and whole grain cereals.  Low-sodium cereals. Corn or whole wheat flour tortillas. Whole grain cornbread. Whole grain crackers. Low-sodium crackers. Vegetables Fresh or frozen vegetables (raw, steamed, roasted, or grilled). Low-sodium or reduced-sodium tomato and vegetable juices. Low-sodium or reduced-sodium tomato sauce and paste. Low-sodium or reduced-sodium canned vegetables.  Fruits All fresh, canned (in natural juice), or frozen fruits. Meat and Other Protein Products Ground beef (85% or leaner), grass-fed beef, or beef trimmed of fat. Skinless chicken or [place]. Ground chicken or [place]. Pork trimmed of fat. All fish and seafood. Eggs. Dried beans, peas, or lentils. Unsalted nuts and seeds. Unsalted canned beans. Dairy Low-fat dairy products, such as skim or 1% milk, 2% or reduced-fat cheeses, low-fat ricotta or cottage cheese, or plain low-fat yogurt. Low-sodium or reduced-sodium cheeses. Fats and Oils Tub margarines without trans fats. Light or reduced-fat mayonnaise and salad dressings (reduced sodium). Avocado. Safflower, olive, or canola oils. Natural peanut or almond butter. Other Unsalted popcorn and pretzels. The items listed above may not be a complete list of recommended foods or beverages. Contact your dietitian for more options. WHAT FOODS ARE NOT RECOMMENDED? Grains White bread. White pasta. White rice. Refined cornbread. Bagels and croissants. Crackers that contain trans fat. Vegetables Creamed or fried vegetables. Vegetables in a cheese sauce. Regular canned vegetables. Regular canned tomato sauce and paste. Regular tomato and vegetable juices. Fruits Dried fruits. Canned fruit in light or heavy syrup. Fruit juice. Meat and Other Protein Products Fatty cuts of meat. Ribs, chicken wings, bacon, sausage, bologna, salami, chitterlings, fatback, hot  dogs, bratwurst, and packaged luncheon meats. Salted nuts and seeds. Canned beans with salt. Dairy Whole or 2% milk, cream, half-and-half, and cream  cheese. Whole-fat or sweetened yogurt. Full-fat cheeses or blue cheese. Nondairy creamers and whipped toppings. Processed cheese, cheese spreads, or cheese curds. Condiments Onion and garlic salt, seasoned salt, table salt, and sea salt. Canned and packaged gravies. Worcestershire sauce. Tartar sauce. Barbecue sauce. Teriyaki sauce. Soy sauce, including reduced sodium. Steak sauce. Fish sauce. Oyster sauce. Cocktail sauce. Horseradish. Ketchup and mustard. Meat flavorings and tenderizers. Bouillon cubes. Hot sauce. Tabasco sauce. Marinades. Taco seasonings. Relishes. Fats and Oils Butter, stick margarine, lard, shortening, ghee, and bacon fat. Coconut, palm kernel, or palm oils. Regular salad dressings. Other Pickles and olives. Salted popcorn and pretzels. The items listed above may not be a complete list of foods and beverages to avoid. Contact your dietitian for more information. WHERE CAN I FIND MORE INFORMATION? National Heart, Lung, and Blood Institute: travelstabloid.com   This information is not intended to replace advice given to you by your health care provider. Make sure you discuss any questions you have with your health care provider.   Document Released: 10/28/2011 Document Revised: 11/29/2014 Document Reviewed: 09/12/2013 Elsevier Interactive Patient Education [employer].

## 2016-01-09 NOTE — Progress Notes (Signed)
   Subjective:    Patient ID: Harold White, male    DOB: 1951-08-10, 65 y.o.   MRN: [id number]  HPI Patient here for complete physical Has history of borderline elevated blood pressure and prediabetes. Takes no medications other than aspirin. Never smoked. Drinks about 3-4 beers per day. No history of shingles vaccine. No flu vaccine this season yet. He has declined colonoscopy in the past and continues to do so.  Past Medical History  Diagnosis Date  . Chicken pox   . Heart murmur   . Hypertension   . Kidney stones   . Hyperlipidemia    Past Surgical History  Procedure Laterality Date  . Tonsillectomy      195    reports that he has never smoked. He does not have any smokeless tobacco history on file. He reports that he drinks alcohol. His drug history is not on file. family history includes Hypertension in his mother; Stroke in his mother. No Known Allergies    Review of Systems  Constitutional: Negative for fever, activity change, appetite change and fatigue.  HENT: Negative for congestion, ear pain and trouble swallowing.   Eyes: Negative for pain and visual disturbance.  Respiratory: Negative for cough, shortness of breath and wheezing.   Cardiovascular: Negative for chest pain and palpitations.  Gastrointestinal: Negative for nausea, vomiting, abdominal pain, diarrhea, constipation, blood in stool, abdominal distention and rectal pain.  Genitourinary: Negative for dysuria, hematuria and testicular pain.  Musculoskeletal: Negative for joint swelling and arthralgias.  Skin: Negative for rash.  Neurological: Negative for dizziness, syncope and headaches.  Hematological: Negative for adenopathy.  Psychiatric/Behavioral: Negative for confusion and dysphoric mood.       Objective:   Physical Exam  Constitutional: He is oriented to person, place, and time. He appears well-developed and well-nourished. No distress.  HENT:  Head: Normocephalic and atraumatic.  Right  Ear: External ear normal.  Left Ear: External ear normal.  Mouth/Throat: Oropharynx is clear and moist.  Eyes: Conjunctivae and EOM are normal. Pupils are equal, round, and reactive to light.  Neck: Normal range of motion. Neck supple. No thyromegaly present.  Cardiovascular: Normal rate, regular rhythm and normal heart sounds.   No murmur heard. Pulmonary/Chest: No respiratory distress. He has no wheezes. He has no rales.  Abdominal: Soft. Bowel sounds are normal. He exhibits no distension and no mass. There is no tenderness. There is no rebound and no guarding.  Musculoskeletal: He exhibits no edema.  Lymphadenopathy:    He has no cervical adenopathy.  Neurological: He is alert and oriented to person, place, and time. He displays normal reflexes. No cranial nerve deficit.  Skin: No rash noted.  Psychiatric: He has a normal mood and affect.          Assessment & Plan:  Physical exam. Flu vaccine given. We'll encourage him to consider shingles vaccine and he will check on insurance coverage. He declines colonoscopy and understands risk of not getting this. Labs reviewed. No major changes. Blood pressure slightly elevated. Reduce alcohol to no more than 2 drinks per day. Lose some weight. Monitor blood pressure regularly over the next couple of months. Be in touch if consistently greater than 150/90.

## 2017-01-03 ENCOUNTER — Other Ambulatory Visit (INDEPENDENT_AMBULATORY_CARE_PROVIDER_SITE_OTHER): Payer: BC Managed Care – PPO

## 2017-01-03 DIAGNOSIS — Z0000 Encounter for general adult medical examination without abnormal findings: Secondary | ICD-10-CM | POA: Diagnosis not present

## 2017-01-03 LAB — BASIC METABOLIC PANEL
BUN: 15 mg/dL (ref 6–23)
CALCIUM: 9.2 mg/dL (ref 8.4–10.5)
CO2: 27 meq/L (ref 19–32)
Chloride: 103 mEq/L (ref 96–112)
Creatinine, Ser: 0.95 mg/dL (ref 0.40–1.50)
GFR: 84.8 mL/min (ref >60.00)
GLUCOSE: 115 mg/dL — AB (ref 70–99)
POTASSIUM: 4.7 meq/L (ref 3.5–5.1)
SODIUM: 139 meq/L (ref 135–145)

## 2017-01-03 LAB — HEPATIC FUNCTION PANEL
ALBUMIN: 4.4 g/dL (ref 3.5–5.2)
ALK PHOS: 50 U/L (ref 39–117)
ALT: 22 U/L (ref 0–53)
AST: 16 U/L (ref 0–37)
BILIRUBIN DIRECT: 0.1 mg/dL (ref 0.0–0.3)
TOTAL PROTEIN: 6.6 g/dL (ref 6.0–8.3)
Total Bilirubin: 0.6 mg/dL (ref 0.2–1.2)

## 2017-01-03 LAB — LIPID PANEL
CHOL/HDL RATIO: 3
Cholesterol: 217 mg/dL — ABNORMAL HIGH (ref 0–200)
HDL: 62.2 mg/dL (ref >39.00)
LDL Cholesterol: 143 mg/dL — ABNORMAL HIGH (ref 0–99)
NONHDL: 155.28
Triglycerides: 62 mg/dL (ref 0.0–149.0)
VLDL: 12.4 mg/dL (ref 0.0–40.0)

## 2017-01-03 LAB — HEMOGLOBIN A1C: Hgb A1c MFr Bld: 5.9 % (ref 4.6–6.5)

## 2017-01-03 LAB — CBC WITH DIFFERENTIAL/PLATELET
BASOS ABS: 0 10*3/uL (ref 0.0–0.1)
BASOS PCT: 0.7 % (ref 0.0–3.0)
EOS ABS: 0.1 10*3/uL (ref 0.0–0.7)
Eosinophils Relative: 2.7 % (ref 0.0–5.0)
HEMATOCRIT: 43.6 % (ref 39.0–52.0)
Hemoglobin: 14.9 g/dL (ref 13.0–17.0)
LYMPHS PCT: 39.2 % (ref 12.0–46.0)
Lymphs Abs: 2 10*3/uL (ref 0.7–4.0)
MCHC: 34.2 g/dL (ref 30.0–36.0)
MCV: 89.4 fl (ref 78.0–100.0)
Monocytes Absolute: 0.4 10*3/uL (ref 0.1–1.0)
Monocytes Relative: 8.4 % (ref 3.0–12.0)
NEUTROS ABS: 2.5 10*3/uL (ref 1.4–7.7)
Neutrophils Relative %: 49 % (ref 43.0–77.0)
Platelets: 140 10*3/uL — ABNORMAL LOW (ref 150.0–400.0)
RBC: 4.87 Mil/uL (ref 4.22–5.81)
RDW: 13.7 % (ref 11.5–15.5)
WBC: 5.1 10*3/uL (ref 4.0–10.5)

## 2017-01-03 LAB — PSA: PSA: 0.48 ng/mL (ref 0.10–4.00)

## 2017-01-03 LAB — TSH: TSH: 2.65 u[IU]/mL (ref 0.35–4.50)

## 2017-01-10 ENCOUNTER — Encounter: Payer: Self-pay | Admitting: Family Medicine

## 2017-01-10 ENCOUNTER — Ambulatory Visit (INDEPENDENT_AMBULATORY_CARE_PROVIDER_SITE_OTHER): Payer: BC Managed Care – PPO | Admitting: Family Medicine

## 2017-01-10 VITALS — BP 138/78 | HR 59 | Ht 68.5 in | Wt 191.3 lb

## 2017-01-10 DIAGNOSIS — Z0000 Encounter for general adult medical examination without abnormal findings: Secondary | ICD-10-CM | POA: Diagnosis not present

## 2017-01-10 NOTE — Patient Instructions (Signed)
Consider shingles vaccine .   Scale back beer use at night Monitor blood pressure and be in touch if consistently > 140/90.

## 2017-01-10 NOTE — Progress Notes (Signed)
Pre visit review using our clinic review tool, if applicable. No additional management support is needed unless otherwise documented below in the visit note. 

## 2017-01-10 NOTE — Progress Notes (Signed)
Subjective:     Patient ID: Harold GibbonJames A White, male   DOB: Dec 10, 1950, 66 y.o.   MRN: [id number]  HPI Patient here for physical exam. He has history of prediabetes. No consistent exercise. No history of shingles vaccine. Does not monitor blood sugars regularly. No polyuria or polydipsia. Nonsmoker. Generally drinks about 2 beers per night. No insomnia issues. Colonoscopy up-to-date.  No regular medications.    Past Medical History:  Diagnosis Date  . Chicken pox   . Heart murmur   . Hyperlipidemia   . Hypertension   . Kidney stones    Past Surgical History:  Procedure Laterality Date  . TONSILLECTOMY     195    reports that he has never smoked. He has never used smokeless tobacco. He reports that he drinks alcohol. He reports that he does not use drugs. family history includes Hypertension in his mother; Stroke in his mother. No Known Allergies   Review of Systems  Constitutional: Negative for activity change, appetite change, fatigue and fever.  HENT: Negative for congestion, ear pain and trouble swallowing.   Eyes: Negative for pain and visual disturbance.  Respiratory: Negative for cough, shortness of breath and wheezing.   Cardiovascular: Negative for chest pain and palpitations.  Gastrointestinal: Negative for abdominal distention, abdominal pain, blood in stool, constipation, diarrhea, nausea, rectal pain and vomiting.  Genitourinary: Negative for dysuria, hematuria and testicular pain.  Musculoskeletal: Negative for arthralgias and joint swelling.  Skin: Negative for rash.  Neurological: Negative for dizziness, syncope and headaches.  Hematological: Negative for adenopathy.  Psychiatric/Behavioral: Negative for confusion and dysphoric mood.       Objective:   Physical Exam  Constitutional: He is oriented to person, place, and time. He appears well-developed and well-nourished. No distress.  HENT:  Head: Normocephalic and atraumatic.  Right Ear: External ear normal.   Left Ear: External ear normal.  Mouth/Throat: Oropharynx is clear and moist.  Eyes: Conjunctivae and EOM are normal. Pupils are equal, round, and reactive to light.  Neck: Normal range of motion. Neck supple. No thyromegaly present.  Cardiovascular: Normal rate, regular rhythm and normal heart sounds.   No murmur heard. Pulmonary/Chest: No respiratory distress. He has no wheezes. He has no rales.  Abdominal: Soft. Bowel sounds are normal. He exhibits no distension and no mass. There is no tenderness. There is no rebound and no guarding.  Musculoskeletal: He exhibits no edema.  Lymphadenopathy:    He has no cervical adenopathy.  Neurological: He is alert and oriented to person, place, and time. He displays normal reflexes. No cranial nerve deficit.  Skin: No rash noted.  Psychiatric: He has a normal mood and affect.       Assessment:     Physical exam. Labs reviewed. He has prediabetes which has been stable for several years and mild hyperlipidemia. He has mild chronic thrombocytopenia which is unchanged. Initial elevated blood pressure but improved at follow-up after several minutes of rest    Plan:     -Encouraged to lose some weight -Scale back alcohol use -Offered shingles vaccine and he declines at this time. Continue yearly flu vaccine  Kristian CoveyBruce W Lezlee Gills MD [facility]

## 2018-03-24 ENCOUNTER — Encounter: Payer: Self-pay | Admitting: Family Medicine

## 2018-03-24 ENCOUNTER — Ambulatory Visit (INDEPENDENT_AMBULATORY_CARE_PROVIDER_SITE_OTHER): Payer: Medicare Other | Admitting: Family Medicine

## 2018-03-24 VITALS — BP 130/80 | HR 60 | Temp 98.3°F | Ht 69.75 in | Wt 190.6 lb

## 2018-03-24 DIAGNOSIS — D696 Thrombocytopenia, unspecified: Secondary | ICD-10-CM | POA: Diagnosis not present

## 2018-03-24 DIAGNOSIS — Z125 Encounter for screening for malignant neoplasm of prostate: Secondary | ICD-10-CM

## 2018-03-24 DIAGNOSIS — R7303 Prediabetes: Secondary | ICD-10-CM

## 2018-03-24 DIAGNOSIS — E7800 Pure hypercholesterolemia, unspecified: Secondary | ICD-10-CM

## 2018-03-24 DIAGNOSIS — Z0000 Encounter for general adult medical examination without abnormal findings: Secondary | ICD-10-CM | POA: Diagnosis not present

## 2018-03-24 DIAGNOSIS — Z23 Encounter for immunization: Secondary | ICD-10-CM | POA: Diagnosis not present

## 2018-03-24 DIAGNOSIS — Z1159 Encounter for screening for other viral diseases: Secondary | ICD-10-CM | POA: Diagnosis not present

## 2018-03-24 LAB — BASIC METABOLIC PANEL
BUN: 13 mg/dL (ref 6–23)
CHLORIDE: 104 meq/L (ref 96–112)
CO2: 27 meq/L (ref 19–32)
Calcium: 9 mg/dL (ref 8.4–10.5)
Creatinine, Ser: 0.9 mg/dL (ref 0.40–1.50)
GFR: 89.91 mL/min (ref >60.00)
GLUCOSE: 122 mg/dL — AB (ref 70–99)
Potassium: 4.4 mEq/L (ref 3.5–5.1)
Sodium: 139 mEq/L (ref 135–145)

## 2018-03-24 LAB — CBC WITH DIFFERENTIAL/PLATELET
BASOS ABS: 0 10*3/uL (ref 0.0–0.1)
Basophils Relative: 0.5 % (ref 0.0–3.0)
Eosinophils Absolute: 0.2 10*3/uL (ref 0.0–0.7)
Eosinophils Relative: 3.7 % (ref 0.0–5.0)
HEMATOCRIT: 43.2 % (ref 39.0–52.0)
HEMOGLOBIN: 14.9 g/dL (ref 13.0–17.0)
LYMPHS PCT: 31 % (ref 12.0–46.0)
Lymphs Abs: 1.5 10*3/uL (ref 0.7–4.0)
MCHC: 34.6 g/dL (ref 30.0–36.0)
MCV: 90.3 fl (ref 78.0–100.0)
MONOS PCT: 7.2 % (ref 3.0–12.0)
Monocytes Absolute: 0.4 10*3/uL (ref 0.1–1.0)
Neutro Abs: 2.8 10*3/uL (ref 1.4–7.7)
Neutrophils Relative %: 57.6 % (ref 43.0–77.0)
Platelets: 149 10*3/uL — ABNORMAL LOW (ref 150.0–400.0)
RBC: 4.79 Mil/uL (ref 4.22–5.81)
RDW: 13.6 % (ref 11.5–15.5)
WBC: 4.9 10*3/uL (ref 4.0–10.5)

## 2018-03-24 LAB — LIPID PANEL
CHOL/HDL RATIO: 4
Cholesterol: 208 mg/dL — ABNORMAL HIGH (ref 0–200)
HDL: 53.7 mg/dL (ref >39.00)
LDL Cholesterol: 140 mg/dL — ABNORMAL HIGH (ref 0–99)
NONHDL: 154.48
Triglycerides: 73 mg/dL (ref 0.0–149.0)
VLDL: 14.6 mg/dL (ref 0.0–40.0)

## 2018-03-24 LAB — HEPATIC FUNCTION PANEL
ALBUMIN: 4.2 g/dL (ref 3.5–5.2)
ALT: 18 U/L (ref 0–53)
AST: 15 U/L (ref 0–37)
Alkaline Phosphatase: 47 U/L (ref 39–117)
BILIRUBIN DIRECT: 0.1 mg/dL (ref 0.0–0.3)
TOTAL PROTEIN: 6.4 g/dL (ref 6.0–8.3)
Total Bilirubin: 0.6 mg/dL (ref 0.2–1.2)

## 2018-03-24 LAB — PSA: PSA: 0.52 ng/mL (ref 0.10–4.00)

## 2018-03-24 LAB — HEMOGLOBIN A1C: HEMOGLOBIN A1C: 5.9 % (ref 4.6–6.5)

## 2018-03-24 NOTE — Progress Notes (Addendum)
  Subjective:     Patient ID: Harold White, male   DOB: August 22, 1951, 67 y.o.   MRN: [id number]  HPI Patient seen for physical exam/medical follow up. There is notation that he had previous colonoscopy- though he states today he has never had colonoscopy screening. No family history of colon cancer. No history of shingles vaccine. Just turned 67 this year. No history of Prevnar. Tetanus is up-to-date. He has history of hyperlipidemia, mild thrombocytopenia, prediabetes range blood sugars. No polyuria or polydipsia.  No hx of Type 2 diabetes or premature CAD.   Takes no regular medications. Appetite and weight are stable. No specific complaints today.  Past Medical History:  Diagnosis Date  . Chicken pox   . Heart murmur   . Hyperlipidemia   . Hypertension   . Kidney stones    Past Surgical History:  Procedure Laterality Date  . TONSILLECTOMY     195    reports that he has never smoked. He has never used smokeless tobacco. He reports that he drinks alcohol. He reports that he does not use drugs. family history includes Hypertension in his mother; Stroke in his mother. No Known Allergies   Review of Systems  Constitutional: Negative for activity change, appetite change, fatigue and fever.  HENT: Negative for congestion, ear pain and trouble swallowing.   Eyes: Negative for pain and visual disturbance.  Respiratory: Negative for cough, shortness of breath and wheezing.   Cardiovascular: Negative for chest pain and palpitations.  Gastrointestinal: Negative for abdominal distention, abdominal pain, blood in stool, constipation, diarrhea, nausea, rectal pain and vomiting.  Endocrine: Negative for polydipsia and polyuria.  Genitourinary: Negative for dysuria, hematuria and testicular pain.  Musculoskeletal: Negative for arthralgias and joint swelling.  Skin: Negative for rash.  Neurological: Negative for dizziness, syncope and headaches.  Hematological: Negative for adenopathy.   Psychiatric/Behavioral: Negative for confusion and dysphoric mood.       Objective:   Physical Exam  Constitutional: He appears well-developed and well-nourished.  HENT:  Mouth/Throat: Oropharynx is clear and moist.  Cerumen impaction bilaterally  Neck: Normal range of motion. Neck supple. No thyromegaly present.  Cardiovascular: Normal rate and regular rhythm.  Pulmonary/Chest: Effort normal and breath sounds normal. He has no wheezes. He has no rales.  Abdominal: Soft. Bowel sounds are normal. He exhibits no mass. There is no tenderness. There is no rebound and no guarding. No hernia.  Genitourinary: Rectum normal and prostate normal.  Musculoskeletal: He exhibits no edema.  Lymphadenopathy:    He has no cervical adenopathy.       Assessment:     Physical exam. The following issues were addressed today. He has past history of mild hyperlipidemia, mild chronic thrombocytopenia, and prediabetes    Plan:     -Prevnar 13 given recommend and Pneumovax in 6-12 months -We strongly advise colon cancer screening either with cologuard or colonoscopy and he will consider and get back with US -Obtain labs today with lipid panel, basic metabolic panel, A1c, CBC, hepatitis C antibody, and PSA -Consider new shingles vaccine and he will check on coverage and consider getting at pharmacy  Kristian Covey MD [facility]

## 2018-03-24 NOTE — Patient Instructions (Signed)
Consider colon cancer screening with cologuard or colonoscopy. Let us know if interested in pursuing either one of these.  Recommend Pneumovax in 6-12 months  Consider new shingles vaccine-shingrix  We will call you with lab work done today

## 2018-03-27 ENCOUNTER — Encounter: Payer: Self-pay | Admitting: Family Medicine

## 2018-03-27 LAB — HEPATITIS C ANTIBODY
Hepatitis C Ab: NONREACTIVE
SIGNAL TO CUT-OFF: 0.01 (ref <1.00)

## 2018-08-17 DIAGNOSIS — H11153 Pinguecula, bilateral: Secondary | ICD-10-CM | POA: Diagnosis not present

## 2018-08-17 DIAGNOSIS — H40013 Open angle with borderline findings, low risk, bilateral: Secondary | ICD-10-CM | POA: Diagnosis not present

## 2018-08-17 DIAGNOSIS — H2513 Age-related nuclear cataract, bilateral: Secondary | ICD-10-CM | POA: Diagnosis not present

## 2018-08-17 DIAGNOSIS — H353132 Nonexudative age-related macular degeneration, bilateral, intermediate dry stage: Secondary | ICD-10-CM | POA: Diagnosis not present

## 2018-08-28 ENCOUNTER — Telehealth: Payer: Self-pay | Admitting: Family Medicine

## 2018-08-28 NOTE — Telephone Encounter (Signed)
I have never spoken with this pt about this visit/bill. There is no documentation in chart TE/CRM of any conversation about this DOS.  Dr. Caryl Never - Pt is asking if OV Apr 11, 2018 could be re-coded to a Welcome To Medicare visit instead of a CPE as Medicare is denying payment. Please advise. Thanks!

## 2018-08-28 NOTE — Telephone Encounter (Signed)
Copied from CRM [id number]. Topic: Quick Communication - See Telephone Encounter >> Aug 28, 2018  9:29 AM Jens Som A wrote: CRM for notification. See Telephone encounter for: 08/28/18.  Patient is calling in regards to his bill.  He was told that it was coded wrong.  The office told the patient that they would change the code to welcome to Medicare.  He got a second bill. And he has already spoken to the billing dept.  They advised him to discuss the bill with the office. Please advise [phone]

## 2018-08-29 NOTE — Telephone Encounter (Signed)
Let him know we will try to re-code as problem oriented visit- since we did discuss chronic problems- and route back to me  Thanks!

## 2018-08-30 NOTE — Telephone Encounter (Signed)
Spoke with pt and advised. He said thank you very much! Pt has been advised this can take some time.  Dr. Caryl Never - Please notate which CPT visit code you would like to use and I will email charge correction to make the change and refile claim. Thanks!

## 2018-09-01 NOTE — Addendum Note (Signed)
Addended by: Kristian Covey on: 09/01/2018 09:36 PM   Modules accepted: Level of Service

## 2018-09-01 NOTE — Telephone Encounter (Signed)
I would use [id number] (established level 4).   Thanks!

## 2018-09-05 NOTE — Telephone Encounter (Signed)
Pt account has been noted. Claim will be resubmitted to insurance.

## 2018-11-04 DIAGNOSIS — Z23 Encounter for immunization: Secondary | ICD-10-CM | POA: Diagnosis not present

## 2019-09-15 DIAGNOSIS — U071 COVID-19: Secondary | ICD-10-CM | POA: Diagnosis not present

## 2019-09-15 DIAGNOSIS — R432 Parageusia: Secondary | ICD-10-CM | POA: Diagnosis not present

## 2019-10-03 ENCOUNTER — Other Ambulatory Visit: Payer: Self-pay

## 2019-10-03 ENCOUNTER — Ambulatory Visit (INDEPENDENT_AMBULATORY_CARE_PROVIDER_SITE_OTHER): Payer: Medicare Other | Admitting: Family Medicine

## 2019-10-03 ENCOUNTER — Encounter: Payer: Self-pay | Admitting: Family Medicine

## 2019-10-03 VITALS — BP 120/82 | HR 51 | Temp 97.2°F | Ht 69.5 in | Wt 193.2 lb

## 2019-10-03 DIAGNOSIS — R7303 Prediabetes: Secondary | ICD-10-CM

## 2019-10-03 DIAGNOSIS — Z125 Encounter for screening for malignant neoplasm of prostate: Secondary | ICD-10-CM

## 2019-10-03 DIAGNOSIS — Z8619 Personal history of other infectious and parasitic diseases: Secondary | ICD-10-CM | POA: Diagnosis not present

## 2019-10-03 DIAGNOSIS — Z23 Encounter for immunization: Secondary | ICD-10-CM

## 2019-10-03 DIAGNOSIS — E7800 Pure hypercholesterolemia, unspecified: Secondary | ICD-10-CM | POA: Diagnosis not present

## 2019-10-03 DIAGNOSIS — Z8616 Personal history of COVID-19: Secondary | ICD-10-CM

## 2019-10-03 LAB — BASIC METABOLIC PANEL
BUN: 13 mg/dL (ref 6–23)
CO2: 28 mEq/L (ref 19–32)
Calcium: 9 mg/dL (ref 8.4–10.5)
Chloride: 104 mEq/L (ref 96–112)
Creatinine, Ser: 1.09 mg/dL (ref 0.40–1.50)
GFR: 67.5 mL/min (ref >60.00)
Glucose, Bld: 122 mg/dL — ABNORMAL HIGH (ref 70–99)
Potassium: 4.5 mEq/L (ref 3.5–5.1)
Sodium: 139 mEq/L (ref 135–145)

## 2019-10-03 LAB — LIPID PANEL
Cholesterol: 206 mg/dL — ABNORMAL HIGH (ref 0–200)
HDL: 50.7 mg/dL (ref >39.00)
LDL Cholesterol: 135 mg/dL — ABNORMAL HIGH (ref 0–99)
NonHDL: 154.89
Total CHOL/HDL Ratio: 4
Triglycerides: 99 mg/dL (ref 0.0–149.0)
VLDL: 19.8 mg/dL (ref 0.0–40.0)

## 2019-10-03 LAB — HEMOGLOBIN A1C: Hgb A1c MFr Bld: 6 % (ref 4.6–6.5)

## 2019-10-03 LAB — HEPATIC FUNCTION PANEL
ALT: 20 U/L (ref 0–53)
AST: 15 U/L (ref 0–37)
Albumin: 4.3 g/dL (ref 3.5–5.2)
Alkaline Phosphatase: 52 U/L (ref 39–117)
Bilirubin, Direct: 0.1 mg/dL (ref 0.0–0.3)
Total Bilirubin: 0.7 mg/dL (ref 0.2–1.2)
Total Protein: 6.5 g/dL (ref 6.0–8.3)

## 2019-10-03 LAB — PSA, MEDICARE: PSA: 0.57 ng/ml (ref 0.10–4.00)

## 2019-10-03 NOTE — Patient Instructions (Signed)
Preventive Care 75 Years and Older, Male Preventive care refers to lifestyle choices and visits with your health care provider that can promote health and wellness. This includes:  A yearly physical exam. This is also called an annual well check.  Regular dental and eye exams.  Immunizations.  Screening for certain conditions.  Healthy lifestyle choices, such as diet and exercise. What can I expect for my preventive care visit? Physical exam Your health care provider will check:  Height and weight. These may be used to calculate body mass index (BMI), which is a measurement that tells if you are at a healthy weight.  Heart rate and blood pressure.  Your skin for abnormal spots. Counseling Your health care provider may ask you questions about:  Alcohol, tobacco, and drug use.  Emotional well-being.  Home and relationship well-being.  Sexual activity.  Eating habits.  History of falls.  Memory and ability to understand (cognition).  Work and work Statistician. What immunizations do I need?  Influenza (flu) vaccine  This is recommended every year. Tetanus, diphtheria, and pertussis (Tdap) vaccine  You may need a Td booster every 10 years. Varicella (chickenpox) vaccine  You may need this vaccine if you have not already been vaccinated. Zoster (shingles) vaccine  You may need this after age 50. Pneumococcal conjugate (PCV13) vaccine  One dose is recommended after age 24. Pneumococcal polysaccharide (PPSV23) vaccine  One dose is recommended after age 33. Measles, mumps, and rubella (MMR) vaccine  You may need at least one dose of MMR if you were born in 1957 or later. You may also need a second dose. Meningococcal conjugate (MenACWY) vaccine  You may need this if you have certain conditions. Hepatitis A vaccine  You may need this if you have certain conditions or if you travel or work in places where you may be exposed to hepatitis A. Hepatitis B vaccine   You may need this if you have certain conditions or if you travel or work in places where you may be exposed to hepatitis B. Haemophilus influenzae type b (Hib) vaccine  You may need this if you have certain conditions. You may receive vaccines as individual doses or as more than one vaccine together in one shot (combination vaccines). Talk with your health care provider about the risks and benefits of combination vaccines. What tests do I need? Blood tests  Lipid and cholesterol levels. These may be checked every 5 years, or more frequently depending on your overall health.  Hepatitis C test.  Hepatitis B test. Screening  Lung cancer screening. You may have this screening every year starting at age 74 if you have a 30-pack-year history of smoking and currently smoke or have quit within the past 15 years.  Colorectal cancer screening. All adults should have this screening starting at age 57 and continuing until age 54. Your health care provider may recommend screening at age 47 if you are at increased risk. You will have tests every 1-10 years, depending on your results and the type of screening test.  Prostate cancer screening. Recommendations will vary depending on your family history and other risks.  Diabetes screening. This is done by checking your blood sugar (glucose) after you have not eaten for a while (fasting). You may have this done every 1-3 years.  Abdominal aortic aneurysm (AAA) screening. You may need this if you are a current or former smoker.  Sexually transmitted disease (STD) testing. Follow these instructions at home: Eating and drinking  Eat  a diet that includes fresh fruits and vegetables, whole grains, lean protein, and low-fat dairy products. Limit your intake of foods with high amounts of sugar, saturated fats, and salt.  Take vitamin and mineral supplements as recommended by your health care provider.  Do not drink alcohol if your health care provider  tells you not to drink.  If you drink alcohol: ? Limit how much you have to 0-2 drinks a day. ? Be aware of how much alcohol is in your drink. In the [place] one drink equals one 12 oz bottle of beer (355 mL), one 5 oz glass of wine (148 mL), or one 1 oz glass of hard liquor (44 mL). Lifestyle  Take daily care of your teeth and gums.  Stay active. Exercise for at least 30 minutes on 5 or more days each week.  Do not use any products that contain nicotine or tobacco, such as cigarettes, e-cigarettes, and chewing tobacco. If you need help quitting, ask your health care provider.  If you are sexually active, practice safe sex. Use a condom or other form of protection to prevent STIs (sexually transmitted infections).  Talk with your health care provider about taking a low-dose aspirin or statin. What's next?  Visit your health care provider once a year for a well check visit.  Ask your health care provider how often you should have your eyes and teeth checked.  Stay up to date on all vaccines. This information is not intended to replace advice given to you by your health care provider. Make sure you discuss any questions you have with your health care provider. Document Released: 12/05/2015 Document Revised: 11/02/2018 Document Reviewed: 11/02/2018 Elsevier Patient Education  2020 [employer]  

## 2019-10-03 NOTE — Progress Notes (Signed)
  Subjective:     Patient ID: Harold White, male   DOB: August 15, 1951, 68 y.o.   MRN: [id number]  HPI Harold White is here for medical follow-up.  He actually had Covid infection back in October.  He had relatively mild symptoms and fever for just couple days.  His only lingering symptom is some loss of smell and taste.  He has history of prediabetes range blood sugars and hyperlipidemia.  Currently takes no prescription medications.  Denies any polyuria or polydipsia.  Weight is stable.  Health maintenance reviewed:  -Needs flu vaccine and Pneumovax -Tetanus due 2023 -Colonoscopy due 2024 -Hep C screening last year negative -No history of shingles vaccine  Past Medical History:  Diagnosis Date  . Chicken pox   . Heart murmur   . Hyperlipidemia   . Hypertension   . Kidney stones    Past Surgical History:  Procedure Laterality Date  . TONSILLECTOMY     195    reports that he has never smoked. He has never used smokeless tobacco. He reports current alcohol use. He reports that he does not use drugs. family history includes Hypertension in his mother; Stroke in his mother. No Known Allergies    Review of Systems  Constitutional: Negative for fatigue.  Eyes: Negative for visual disturbance.  Respiratory: Negative for cough, chest tightness and shortness of breath.   Cardiovascular: Negative for chest pain, palpitations and leg swelling.  Endocrine: Negative for polydipsia and polyuria.  Neurological: Negative for dizziness, syncope, weakness, light-headedness and headaches.       Objective:   Physical Exam Constitutional:      Appearance: He is well-developed.  HENT:     Right Ear: External ear normal.     Left Ear: External ear normal.  Eyes:     Pupils: Pupils are equal, round, and reactive to light.  Neck:     Musculoskeletal: Neck supple.     Thyroid: No thyromegaly.  Cardiovascular:     Rate and Rhythm: Normal rate and regular rhythm.  Pulmonary:     Effort:  Pulmonary effort is normal. No respiratory distress.     Breath sounds: Normal breath sounds. No wheezing or rales.  Neurological:     Mental Status: He is alert and oriented to person, place, and time.        Assessment:     #1 history of prediabetes.  #2 history of hyperlipidemia  #3 recent Covid infection back in October mostly recovered at this time with exception of loss of taste    Plan:     -Recommend flu vaccine and also Pneumovax. -We discussed Shingrix vaccine and he will check on insurance coverage -Check labs with basic metabolic panel, lipid panel, hepatic panel, A1c, and PSA screen -Discussed diabetes prevention with regular exercise, weight control, and reduction in high glycemic foods  Eulas Post MD [facility]

## 2020-01-17 DIAGNOSIS — Z23 Encounter for immunization: Secondary | ICD-10-CM | POA: Diagnosis not present

## 2020-02-15 DIAGNOSIS — Z23 Encounter for immunization: Secondary | ICD-10-CM | POA: Diagnosis not present

## 2020-10-03 ENCOUNTER — Other Ambulatory Visit: Payer: Self-pay

## 2020-10-03 ENCOUNTER — Encounter: Payer: Self-pay | Admitting: Family Medicine

## 2020-10-03 ENCOUNTER — Ambulatory Visit (INDEPENDENT_AMBULATORY_CARE_PROVIDER_SITE_OTHER): Payer: Medicare Other | Admitting: Family Medicine

## 2020-10-03 VITALS — BP 116/68 | HR 64 | Temp 97.9°F | Ht 69.5 in | Wt 184.8 lb

## 2020-10-03 DIAGNOSIS — R7303 Prediabetes: Secondary | ICD-10-CM | POA: Diagnosis not present

## 2020-10-03 DIAGNOSIS — Z23 Encounter for immunization: Secondary | ICD-10-CM | POA: Diagnosis not present

## 2020-10-03 DIAGNOSIS — D696 Thrombocytopenia, unspecified: Secondary | ICD-10-CM

## 2020-10-03 DIAGNOSIS — Z125 Encounter for screening for malignant neoplasm of prostate: Secondary | ICD-10-CM

## 2020-10-03 DIAGNOSIS — E7800 Pure hypercholesterolemia, unspecified: Secondary | ICD-10-CM | POA: Diagnosis not present

## 2020-10-03 NOTE — Patient Instructions (Signed)

## 2020-10-03 NOTE — Progress Notes (Signed)
Established Patient Office Visit  Subjective:  Patient ID: Harold White, male    DOB: [birth year]  Age: 69 y.o. MRN: [id number]  CC:  Chief Complaint  Patient presents with  . Annual Exam    phyiscal     HPI Harold White presents for medical follow-up.  Generally fairly healthy.  He retired last spring.  Had been working part-time prior to that.  He has history of hyperlipidemia, prediabetes, mild thrombocytopenia.  Is requesting follow-up labs today.  Needs flu vaccine.  No history of shingles vaccine and declines.  Has had Covid vaccine.  Other health maintenance up-to-date.  No polyuria or polydipsia.  Walks some for exercise.  Currently renovating house he inherited  No family history of premature CAD.  Family history reviewed.  His mom had pancreatic cancer.  Father died apparently following complications of hip surgery.  May have had sepsis.  He has a sister who is alive and well.  The 10-year ASCVD risk score Denman George DC Montez Hageman., et al., 2013) is: 12.7%   Values used to calculate the score:     Age: 53 years     Sex: Male     Is Non-Hispanic African American: No     Diabetic: No     Tobacco smoker: No     Systolic Blood Pressure: 116 mmHg     Is BP treated: No     HDL Cholesterol: 50.7 mg/dL     Total Cholesterol: 206 mg/dL   Past Medical History:  Diagnosis Date  . Chicken pox   . Heart murmur   . Hyperlipidemia   . Hypertension   . Kidney stones     Past Surgical History:  Procedure Laterality Date  . TONSILLECTOMY     195    Family History  Problem Relation Age of Onset  . Stroke Mother   . Hypertension Mother   . Cancer Mother 62       pancreatic     Social History   Socioeconomic History  . Marital status: Married    Spouse name: Not on file  . Number of children: Not on file  . Years of education: Not on file  . Highest education level: Not on file  Occupational History  . Not on file  Tobacco Use  . Smoking status: Never Smoker  .  Smokeless tobacco: Never Used  Vaping Use  . Vaping Use: Never used  Substance and Sexual Activity  . Alcohol use: Yes    Comment: occ  . Drug use: No  . Sexual activity: Not on file  Other Topics Concern  . Not on file  Social History Narrative  . Not on file   Social Determinants of Health   Financial Resource Strain:   . Difficulty of Paying Living Expenses: Not on file  Food Insecurity:   . Worried About Programme researcher, broadcasting/film/video in the Last Year: Not on file  . Ran Out of Food in the Last Year: Not on file  Transportation Needs:   . Lack of Transportation (Medical): Not on file  . Lack of Transportation (Non-Medical): Not on file  Physical Activity:   . Days of Exercise per Week: Not on file  . Minutes of Exercise per Session: Not on file  Stress:   . Feeling of Stress : Not on file  Social Connections:   . Frequency of Communication with Friends and Family: Not on file  . Frequency of Social Gatherings with Friends and Family:  Not on file  . Attends Religious Services: Not on file  . Active Member of Clubs or Organizations: Not on file  . Attends Banker Meetings: Not on file  . Marital Status: Not on file  Intimate Partner Violence:   . Fear of Current or Ex-Partner: Not on file  . Emotionally Abused: Not on file  . Physically Abused: Not on file  . Sexually Abused: Not on file    Outpatient Medications Prior to Visit  Medication Sig Dispense Refill  . Misc Natural Products (TART CHERRY ADVANCED PO) Take by mouth.    . Turmeric 400 MG CAPS Take by mouth.     No facility-administered medications prior to visit.    No Known Allergies  ROS Review of Systems  Constitutional: Negative for fatigue.  Eyes: Negative for visual disturbance.  Respiratory: Negative for cough, chest tightness and shortness of breath.   Cardiovascular: Negative for chest pain, palpitations and leg swelling.  Neurological: Negative for dizziness, syncope, weakness,  light-headedness and headaches.      Objective:    Physical Exam Constitutional:      Appearance: He is well-developed.  HENT:     Right Ear: External ear normal.     Left Ear: External ear normal.  Eyes:     Pupils: Pupils are equal, round, and reactive to light.  Neck:     Thyroid: No thyromegaly.  Cardiovascular:     Rate and Rhythm: Normal rate and regular rhythm.  Pulmonary:     Effort: Pulmonary effort is normal. No respiratory distress.     Breath sounds: Normal breath sounds. No wheezing or rales.  Musculoskeletal:     Cervical back: Neck supple.  Neurological:     Mental Status: He is alert and oriented to person, place, and time.     BP 116/68 (BP Location: Left Arm, Patient Position: Sitting, Cuff Size: Normal)   Pulse 64   Temp 97.9 F (36.6 C) (Oral)   Ht 5' 9.5" (1.765 m)   Wt 184 lb 12.8 oz (83.8 kg)   SpO2 98%   BMI 26.90 kg/m  Wt Readings from Last 3 Encounters:  10/03/20 184 lb 12.8 oz (83.8 kg)  10/03/19 193 lb 3.2 oz (87.6 kg)  03/24/18 190 lb 9.6 oz (86.5 kg)     Health Maintenance Due  Topic Date Due  . INFLUENZA VACCINE  06/22/2020    There are no preventive care reminders to display for this patient.  Lab Results  Component Value Date   TSH 2.65 01/03/2017   Lab Results  Component Value Date   WBC 4.9 03/24/2018   HGB 14.9 03/24/2018   HCT 43.2 03/24/2018   MCV 90.3 03/24/2018   PLT 149.0 (L) 03/24/2018   Lab Results  Component Value Date   NA 139 10/03/2019   K 4.5 10/03/2019   CO2 28 10/03/2019   GLUCOSE 122 (H) 10/03/2019   BUN 13 10/03/2019   CREATININE 1.09 10/03/2019   BILITOT 0.7 10/03/2019   ALKPHOS 52 10/03/2019   AST 15 10/03/2019   ALT 20 10/03/2019   PROT 6.5 10/03/2019   ALBUMIN 4.3 10/03/2019   CALCIUM 9.0 10/03/2019   GFR 67.50 10/03/2019   Lab Results  Component Value Date   CHOL 206 (H) 10/03/2019   Lab Results  Component Value Date   HDL 50.70 10/03/2019   Lab Results  Component Value  Date   LDLCALC 135 (H) 10/03/2019   Lab Results  Component Value Date  TRIG 99.0 10/03/2019   Lab Results  Component Value Date   CHOLHDL 4 10/03/2019   Lab Results  Component Value Date   HGBA1C 6.0 10/03/2019      Assessment & Plan:   Problem List Items Addressed This Visit      Unprioritized   Thrombocytopenia (HCC)   Relevant Orders   CBC with Differential/Platelet   Hypercholesteremia   Relevant Orders   Lipid panel   Hepatic function panel   Prediabetes   Relevant Orders   Basic metabolic panel   Hemoglobin A1c    Other Visit Diagnoses    Flu vaccine need    -  Primary   Relevant Orders   Flu Vaccine QUAD High Dose(Fluad)   Prostate cancer screening       Relevant Orders   PSA, Medicare    -Check labs as above.  He has had prediabetes range blood sugars previously and recheck A1c. -Continue low glycemic and low saturated fat diet -Discussed pros and cons of PSA screening and he wishes to proceed for at least another couple years -Flu vaccine given -Discussed shingles vaccine and he declines  No orders of the defined types were placed in this encounter.   Follow-up: No follow-ups on file.    Evelena Peat, MD

## 2020-10-03 NOTE — Addendum Note (Signed)
Addended by: Lerry Liner on: 10/03/2020 09:25 AM   Modules accepted: Orders

## 2020-10-04 LAB — BASIC METABOLIC PANEL
BUN: 15 mg/dL (ref 7–25)
CO2: 25 mmol/L (ref 20–32)
Calcium: 9.2 mg/dL (ref 8.6–10.3)
Chloride: 105 mmol/L (ref 98–110)
Creat: 1.01 mg/dL (ref 0.70–1.25)
Glucose, Bld: 115 mg/dL — ABNORMAL HIGH (ref 65–99)
Potassium: 4.5 mmol/L (ref 3.5–5.3)
Sodium: 140 mmol/L (ref 135–146)

## 2020-10-04 LAB — PSA: PSA: 0.48 ng/mL (ref <=4.0)

## 2020-10-04 LAB — CBC WITH DIFFERENTIAL/PLATELET
Absolute Monocytes: 393 cells/uL (ref 200–950)
Basophils Absolute: 23 cells/uL (ref 0–200)
Basophils Relative: 0.4 %
Eosinophils Absolute: 103 cells/uL (ref 15–500)
Eosinophils Relative: 1.8 %
HCT: 45.2 % (ref 38.5–50.0)
Hemoglobin: 15.1 g/dL (ref 13.2–17.1)
Lymphs Abs: 1425 cells/uL (ref 850–3900)
MCH: 30.7 pg (ref 27.0–33.0)
MCHC: 33.4 g/dL (ref 32.0–36.0)
MCV: 91.9 fL (ref 80.0–100.0)
MPV: 12.1 fL (ref 7.5–12.5)
Monocytes Relative: 6.9 %
Neutro Abs: 3756 cells/uL (ref 1500–7800)
Neutrophils Relative %: 65.9 %
Platelets: 153 10*3/uL (ref 140–400)
RBC: 4.92 10*6/uL (ref 4.20–5.80)
RDW: 12.9 % (ref 11.0–15.0)
Total Lymphocyte: 25 %
WBC: 5.7 10*3/uL (ref 3.8–10.8)

## 2020-10-04 LAB — LIPID PANEL
Cholesterol: 196 mg/dL (ref <200)
HDL: 64 mg/dL (ref >=40)
LDL Cholesterol (Calc): 117 mg/dL (calc) — ABNORMAL HIGH
Non-HDL Cholesterol (Calc): 132 mg/dL (calc) — ABNORMAL HIGH (ref <130)
Total CHOL/HDL Ratio: 3.1 (calc) (ref <5.0)
Triglycerides: 54 mg/dL (ref <150)

## 2020-10-04 LAB — HEPATIC FUNCTION PANEL
AG Ratio: 2 (calc) (ref 1.0–2.5)
ALT: 18 U/L (ref 9–46)
AST: 16 U/L (ref 10–35)
Albumin: 4.3 g/dL (ref 3.6–5.1)
Alkaline phosphatase (APISO): 57 U/L (ref 35–144)
Bilirubin, Direct: 0.2 mg/dL (ref 0.0–0.2)
Globulin: 2.1 g/dL (calc) (ref 1.9–3.7)
Indirect Bilirubin: 0.6 mg/dL (calc) (ref 0.2–1.2)
Total Bilirubin: 0.8 mg/dL (ref 0.2–1.2)
Total Protein: 6.4 g/dL (ref 6.1–8.1)

## 2020-10-04 LAB — HEMOGLOBIN A1C
Hgb A1c MFr Bld: 5.6 % of total Hgb (ref <5.7)
Mean Plasma Glucose: 114 (calc)
eAG (mmol/L): 6.3 (calc)

## 2020-10-30 DIAGNOSIS — H353131 Nonexudative age-related macular degeneration, bilateral, early dry stage: Secondary | ICD-10-CM | POA: Diagnosis not present

## 2020-11-06 DIAGNOSIS — Z23 Encounter for immunization: Secondary | ICD-10-CM | POA: Diagnosis not present

## 2021-09-14 DIAGNOSIS — Z23 Encounter for immunization: Secondary | ICD-10-CM | POA: Diagnosis not present

## 2021-10-06 ENCOUNTER — Ambulatory Visit (INDEPENDENT_AMBULATORY_CARE_PROVIDER_SITE_OTHER): Payer: Medicare Other | Admitting: Family Medicine

## 2021-10-06 VITALS — BP 140/60 | HR 50 | Temp 97.6°F | Ht 69.5 in | Wt 192.1 lb

## 2021-10-06 DIAGNOSIS — Z125 Encounter for screening for malignant neoplasm of prostate: Secondary | ICD-10-CM | POA: Diagnosis not present

## 2021-10-06 DIAGNOSIS — D696 Thrombocytopenia, unspecified: Secondary | ICD-10-CM

## 2021-10-06 DIAGNOSIS — E7800 Pure hypercholesterolemia, unspecified: Secondary | ICD-10-CM | POA: Diagnosis not present

## 2021-10-06 DIAGNOSIS — R7303 Prediabetes: Secondary | ICD-10-CM

## 2021-10-06 DIAGNOSIS — M7581 Other shoulder lesions, right shoulder: Secondary | ICD-10-CM | POA: Diagnosis not present

## 2021-10-06 LAB — BASIC METABOLIC PANEL
BUN: 17 mg/dL (ref 6–23)
CO2: 29 mEq/L (ref 19–32)
Calcium: 8.9 mg/dL (ref 8.4–10.5)
Chloride: 103 mEq/L (ref 96–112)
Creatinine, Ser: 1 mg/dL (ref 0.40–1.50)
GFR: 77.13 mL/min (ref >60.00)
Glucose, Bld: 121 mg/dL — ABNORMAL HIGH (ref 70–99)
Potassium: 4.8 mEq/L (ref 3.5–5.1)
Sodium: 139 mEq/L (ref 135–145)

## 2021-10-06 LAB — LIPID PANEL
Cholesterol: 203 mg/dL — ABNORMAL HIGH (ref 0–200)
HDL: 57.7 mg/dL (ref >39.00)
LDL Cholesterol: 135 mg/dL — ABNORMAL HIGH (ref 0–99)
NonHDL: 145.64
Total CHOL/HDL Ratio: 4
Triglycerides: 55 mg/dL (ref 0.0–149.0)
VLDL: 11 mg/dL (ref 0.0–40.0)

## 2021-10-06 LAB — CBC WITH DIFFERENTIAL/PLATELET
Basophils Absolute: 0 10*3/uL (ref 0.0–0.1)
Basophils Relative: 0.4 % (ref 0.0–3.0)
Eosinophils Absolute: 0.1 10*3/uL (ref 0.0–0.7)
Eosinophils Relative: 2.2 % (ref 0.0–5.0)
HCT: 43.6 % (ref 39.0–52.0)
Hemoglobin: 14.6 g/dL (ref 13.0–17.0)
Lymphocytes Relative: 31.8 % (ref 12.0–46.0)
Lymphs Abs: 1.8 10*3/uL (ref 0.7–4.0)
MCHC: 33.6 g/dL (ref 30.0–36.0)
MCV: 90.8 fl (ref 78.0–100.0)
Monocytes Absolute: 0.5 10*3/uL (ref 0.1–1.0)
Monocytes Relative: 8 % (ref 3.0–12.0)
Neutro Abs: 3.3 10*3/uL (ref 1.4–7.7)
Neutrophils Relative %: 57.6 % (ref 43.0–77.0)
Platelets: 147 10*3/uL — ABNORMAL LOW (ref 150.0–400.0)
RBC: 4.8 Mil/uL (ref 4.22–5.81)
RDW: 13.6 % (ref 11.5–15.5)
WBC: 5.8 10*3/uL (ref 4.0–10.5)

## 2021-10-06 LAB — HEMOGLOBIN A1C: Hgb A1c MFr Bld: 6.3 % (ref 4.6–6.5)

## 2021-10-06 LAB — HEPATIC FUNCTION PANEL
ALT: 22 U/L (ref 0–53)
AST: 17 U/L (ref 0–37)
Albumin: 4.3 g/dL (ref 3.5–5.2)
Alkaline Phosphatase: 50 U/L (ref 39–117)
Bilirubin, Direct: 0.1 mg/dL (ref 0.0–0.3)
Total Bilirubin: 0.7 mg/dL (ref 0.2–1.2)
Total Protein: 6.5 g/dL (ref 6.0–8.3)

## 2021-10-06 LAB — PSA, MEDICARE: PSA: 0.66 ng/ml (ref 0.10–4.00)

## 2021-10-06 NOTE — Progress Notes (Signed)
Established Patient Office Visit  Subjective:  Patient ID: Harold White, male    DOB: January 13, 1951  Age: 70 y.o. MRN: [id number]  CC:  Chief Complaint  Patient presents with   Annual Exam    HPI Harold White presents for medical follow-up.  He has history of hypertension but currently not treated.  Has home blood pressure cuff but not recently monitoring.  Has had some mild weight gain since last year.  No headaches or dizziness.  No chest pains.  History of mild chronic thrombocytopenia.  He has history of prediabetes range blood sugars.  No polyuria or polydipsia.  He has hyperlipidemia currently not treated.  No family history of premature CAD.  His mother did have a stroke.  His father had rectal cancer but was in his 78s.  Right shoulder pain for couple months.  He thinks this is overuse related.  He is on a lot of old house renovation with painting and scraping paint.  Pain worse with internal rotation and abduction.  Pain is relatively mild at this time.  He is not interested in further intervention yet.  No loss of range of motion.   Past Medical History:  Diagnosis Date   Chicken pox    Heart murmur    Hyperlipidemia    Hypertension    Kidney stones     Past Surgical History:  Procedure Laterality Date   TONSILLECTOMY     195    Family History  Problem Relation Age of Onset   Stroke Mother    Hypertension Mother    Cancer Mother 28       pancreatic     Social History   Socioeconomic History   Marital status: Married    Spouse name: Not on file   Number of children: Not on file   Years of education: Not on file   Highest education level: Not on file  Occupational History   Not on file  Tobacco Use   Smoking status: Never   Smokeless tobacco: Never  Vaping Use   Vaping Use: Never used  Substance and Sexual Activity   Alcohol use: Yes    Comment: occ   Drug use: No   Sexual activity: Not on file  Other Topics Concern   Not on file  Social  History Narrative   Not on file   Social Determinants of Health   Financial Resource Strain: Not on file  Food Insecurity: Not on file  Transportation Needs: Not on file  Physical Activity: Not on file  Stress: Not on file  Social Connections: Not on file  Intimate Partner Violence: Not on file    Outpatient Medications Prior to Visit  Medication Sig Dispense Refill   Misc Natural Products (TART CHERRY ADVANCED PO) Take by mouth.     Turmeric 400 MG CAPS Take by mouth.     No facility-administered medications prior to visit.    No Known Allergies  ROS Review of Systems  Constitutional:  Negative for fatigue.  Eyes:  Negative for visual disturbance.  Respiratory:  Negative for cough, chest tightness and shortness of breath.   Cardiovascular:  Negative for chest pain, palpitations and leg swelling.  Endocrine: Negative for polydipsia and polyuria.  Neurological:  Negative for dizziness, syncope, weakness, light-headedness and headaches.     Objective:    Physical Exam Constitutional:      General: He is not in acute distress.    Appearance: He is well-developed. He is  not ill-appearing or toxic-appearing.  HENT:     Right Ear: External ear normal.     Left Ear: External ear normal.  Eyes:     Pupils: Pupils are equal, round, and reactive to light.  Neck:     Thyroid: No thyromegaly.  Cardiovascular:     Rate and Rhythm: Normal rate and regular rhythm.  Pulmonary:     Effort: Pulmonary effort is normal. No respiratory distress.     Breath sounds: Normal breath sounds. No wheezing or rales.  Musculoskeletal:     Cervical back: Neck supple.     Right lower leg: No edema.     Left lower leg: No edema.     Comments: Right shoulder no localized tenderness.  Does have some mild pain with internal rotation.  Positive empty can test on the right  Neurological:     Mental Status: He is alert and oriented to person, place, and time.    BP 140/60 (BP Location: Left Arm,  Patient Position: Sitting, Cuff Size: Normal)   Pulse (!) 50   Temp 97.6 F (36.4 C) (Oral)   Ht 5' 9.5" (1.765 m)   Wt 192 lb 1.6 oz (87.1 kg)   SpO2 99%   BMI 27.96 kg/m  Wt Readings from Last 3 Encounters:  10/06/21 192 lb 1.6 oz (87.1 kg)  10/03/20 184 lb 12.8 oz (83.8 kg)  10/03/19 193 lb 3.2 oz (87.6 kg)     Health Maintenance Due  Topic Date Due   COVID-19 Vaccine (3 - Booster for Janssen series) 04/11/2020    There are no preventive care reminders to display for this patient.  Lab Results  Component Value Date   TSH 2.65 01/03/2017   Lab Results  Component Value Date   WBC 5.7 10/03/2020   HGB 15.1 10/03/2020   HCT 45.2 10/03/2020   MCV 91.9 10/03/2020   PLT 153 10/03/2020   Lab Results  Component Value Date   NA 140 10/03/2020   K 4.5 10/03/2020   CO2 25 10/03/2020   GLUCOSE 115 (H) 10/03/2020   BUN 15 10/03/2020   CREATININE 1.01 10/03/2020   BILITOT 0.8 10/03/2020   ALKPHOS 52 10/03/2019   AST 16 10/03/2020   ALT 18 10/03/2020   PROT 6.4 10/03/2020   ALBUMIN 4.3 10/03/2019   CALCIUM 9.2 10/03/2020   GFR 67.50 10/03/2019   Lab Results  Component Value Date   CHOL 196 10/03/2020   Lab Results  Component Value Date   HDL 64 10/03/2020   Lab Results  Component Value Date   LDLCALC 117 (H) 10/03/2020   Lab Results  Component Value Date   TRIG 54 10/03/2020   Lab Results  Component Value Date   CHOLHDL 3.1 10/03/2020   Lab Results  Component Value Date   HGBA1C 5.6 10/03/2020      Assessment & Plan:   Problem List Items Addressed This Visit       Unprioritized   Hypercholesteremia   Relevant Orders   Lipid panel   Hepatic function panel   Thrombocytopenia ([place]) - Primary   Relevant Orders   CBC with Differential/Platelet   Prediabetes   Relevant Orders   Basic metabolic panel   Hemoglobin A1c   Other Visit Diagnoses     Prostate cancer screening       Relevant Orders   PSA, Medicare   Tendinitis of right rotator  cuff         -We discussed rotator cuff  tendinitis.  We discussed possible physical therapy and also potential of corticosteroid injection but at this point he wishes to wait  -Monitor blood pressure closely at home and be in touch with consistent readings greater than [id number] systolic.  He is currently drinking about 2-3 beers per day and will try to scale back back.  Keep sodium intake less than 2400 mg daily  -Check labs including A1c with his history of prediabetes.  -Recheck other labs as above  -Flu vaccine already given.  COVID vaccines up-to-date  No orders of the defined types were placed in this encounter.   Follow-up: No follow-ups on file.    Carolann Littler, MD

## 2021-10-06 NOTE — Addendum Note (Signed)
Addended by: Marian Sorrow D on: 10/06/2021 09:58 AM   Modules accepted: Orders

## 2021-10-06 NOTE — Patient Instructions (Signed)
Monitor home blood pressures and be in touch if consistently > 140/90

## 2021-11-02 DIAGNOSIS — H353130 Nonexudative age-related macular degeneration, bilateral, stage unspecified: Secondary | ICD-10-CM | POA: Diagnosis not present

## 2022-08-19 DIAGNOSIS — L570 Actinic keratosis: Secondary | ICD-10-CM | POA: Diagnosis not present

## 2022-08-19 DIAGNOSIS — X32XXXA Exposure to sunlight, initial encounter: Secondary | ICD-10-CM | POA: Diagnosis not present

## 2022-10-11 ENCOUNTER — Encounter: Payer: Self-pay | Admitting: Family Medicine

## 2022-10-11 ENCOUNTER — Ambulatory Visit (INDEPENDENT_AMBULATORY_CARE_PROVIDER_SITE_OTHER): Payer: Medicare Other | Admitting: Family Medicine

## 2022-10-11 VITALS — BP 136/60 | HR 59 | Temp 97.7°F | Ht 69.29 in | Wt 191.7 lb

## 2022-10-11 DIAGNOSIS — D696 Thrombocytopenia, unspecified: Secondary | ICD-10-CM

## 2022-10-11 DIAGNOSIS — E7800 Pure hypercholesterolemia, unspecified: Secondary | ICD-10-CM | POA: Diagnosis not present

## 2022-10-11 DIAGNOSIS — Z125 Encounter for screening for malignant neoplasm of prostate: Secondary | ICD-10-CM | POA: Diagnosis not present

## 2022-10-11 DIAGNOSIS — Z23 Encounter for immunization: Secondary | ICD-10-CM

## 2022-10-11 DIAGNOSIS — R7303 Prediabetes: Secondary | ICD-10-CM

## 2022-10-11 LAB — BASIC METABOLIC PANEL
BUN: 16 mg/dL (ref 6–23)
CO2: 26 mEq/L (ref 19–32)
Calcium: 9 mg/dL (ref 8.4–10.5)
Chloride: 104 mEq/L (ref 96–112)
Creatinine, Ser: 0.96 mg/dL (ref 0.40–1.50)
GFR: 80.43 mL/min (ref >60.00)
Glucose, Bld: 118 mg/dL — ABNORMAL HIGH (ref 70–99)
Potassium: 4.2 mEq/L (ref 3.5–5.1)
Sodium: 139 mEq/L (ref 135–145)

## 2022-10-11 LAB — CBC WITH DIFFERENTIAL/PLATELET
Basophils Absolute: 0 10*3/uL (ref 0.0–0.1)
Basophils Relative: 0.5 % (ref 0.0–3.0)
Eosinophils Absolute: 0.1 10*3/uL (ref 0.0–0.7)
Eosinophils Relative: 2.2 % (ref 0.0–5.0)
HCT: 44.6 % (ref 39.0–52.0)
Hemoglobin: 15.3 g/dL (ref 13.0–17.0)
Lymphocytes Relative: 29.8 % (ref 12.0–46.0)
Lymphs Abs: 1.8 10*3/uL (ref 0.7–4.0)
MCHC: 34.3 g/dL (ref 30.0–36.0)
MCV: 91.2 fl (ref 78.0–100.0)
Monocytes Absolute: 0.5 10*3/uL (ref 0.1–1.0)
Monocytes Relative: 8.4 % (ref 3.0–12.0)
Neutro Abs: 3.6 10*3/uL (ref 1.4–7.7)
Neutrophils Relative %: 59.1 % (ref 43.0–77.0)
Platelets: 144 10*3/uL — ABNORMAL LOW (ref 150.0–400.0)
RBC: 4.89 Mil/uL (ref 4.22–5.81)
RDW: 13.6 % (ref 11.5–15.5)
WBC: 6.2 10*3/uL (ref 4.0–10.5)

## 2022-10-11 LAB — LIPID PANEL
Cholesterol: 218 mg/dL — ABNORMAL HIGH (ref 0–200)
HDL: 57.2 mg/dL (ref >39.00)
LDL Cholesterol: 144 mg/dL — ABNORMAL HIGH (ref 0–99)
NonHDL: 160.67
Total CHOL/HDL Ratio: 4
Triglycerides: 81 mg/dL (ref 0.0–149.0)
VLDL: 16.2 mg/dL (ref 0.0–40.0)

## 2022-10-11 LAB — PSA, MEDICARE: PSA: 1.48 ng/ml (ref 0.10–4.00)

## 2022-10-11 LAB — HEPATIC FUNCTION PANEL
ALT: 20 U/L (ref 0–53)
AST: 16 U/L (ref 0–37)
Albumin: 4.4 g/dL (ref 3.5–5.2)
Alkaline Phosphatase: 46 U/L (ref 39–117)
Bilirubin, Direct: 0.2 mg/dL (ref 0.0–0.3)
Total Bilirubin: 0.8 mg/dL (ref 0.2–1.2)
Total Protein: 6.6 g/dL (ref 6.0–8.3)

## 2022-10-11 LAB — HEMOGLOBIN A1C: Hgb A1c MFr Bld: 6.1 % (ref 4.6–6.5)

## 2022-10-11 NOTE — Progress Notes (Signed)
Established Patient Office Visit  Subjective   Patient ID: DAJOUR PIERPOINT, male    DOB: 1950-12-10  Age: 71 y.o. MRN: [id number]  No chief complaint on file.   HPI   Terran is seen for yearly medical follow-up.  Generally healthy.  He does have history of prediabetes, chronic mild thrombocytopenia, and mild hyperlipidemia.  Currently takes no regular medications.  He does state that he has a "sweet tooth ".  Not restricting carbohydrates greatly.  Denies any polyuria or polydipsia.  Last A1c was 6.3%.  No family history of premature CAD.  His mother died age 54 pancreatic cancer but also history of hypertension and stroke.  His father died age 33 complications of hip fracture possibly from sepsis.  Mr. Hoopes denies any recent chest pains.  No dyspnea.  No dizziness.  Past Medical History:  Diagnosis Date   Chicken pox    Heart murmur    Hyperlipidemia    Hypertension    Kidney stones    Past Surgical History:  Procedure Laterality Date   TONSILLECTOMY     195    reports that he has never smoked. He has never used smokeless tobacco. He reports current alcohol use. He reports that he does not use drugs. family history includes Cancer (age of onset: 44) in his mother; Hypertension in his mother; Stroke in his mother. No Known Allergies  Review of Systems  Constitutional:  Negative for malaise/fatigue.  Eyes:  Negative for blurred vision.  Respiratory:  Negative for shortness of breath.   Cardiovascular:  Negative for chest pain.  Neurological:  Negative for dizziness, weakness and headaches.      Objective:     BP 136/60 (BP Location: Left Arm, Patient Position: Sitting, Cuff Size: Large)   Pulse (!) 59   Temp 97.7 F (36.5 C) (Oral)   Ht 5' 9.29" (1.76 m)   Wt 191 lb 11.2 oz (87 kg)   SpO2 99%   BMI 28.07 kg/m  BP Readings from Last 3 Encounters:  10/11/22 136/60  10/06/21 140/60  10/03/20 116/68   Wt Readings from Last 3 Encounters:  10/11/22 191 lb 11.2  oz (87 kg)  10/06/21 192 lb 1.6 oz (87.1 kg)  10/03/20 184 lb 12.8 oz (83.8 kg)      Physical Exam Vitals reviewed.  Constitutional:      General: He is not in acute distress.    Appearance: He is well-developed.  HENT:     Right Ear: External ear normal.     Left Ear: External ear normal.  Eyes:     Pupils: Pupils are equal, round, and reactive to light.  Neck:     Thyroid: No thyromegaly.  Cardiovascular:     Rate and Rhythm: Normal rate and regular rhythm.  Pulmonary:     Effort: Pulmonary effort is normal. No respiratory distress.     Breath sounds: Normal breath sounds. No wheezing or rales.  Musculoskeletal:     Cervical back: Neck supple.  Neurological:     Mental Status: He is alert and oriented to person, place, and time.      No results found for any visits on 10/11/22.    The 10-year ASCVD risk score (Arnett DK, et al., 2019) is: 17.6%    Assessment & Plan:   Problem List Items Addressed This Visit       Unprioritized   Thrombocytopenia (HCC)   Relevant Orders   CBC with Differential/Platelet   Hypercholesteremia  Relevant Orders   Lipid panel   Hepatic function panel   Prediabetes - Primary   Relevant Orders   Basic metabolic panel   Hemoglobin A1c   Other Visit Diagnoses     Prostate cancer screening       Relevant Orders   PSA, Medicare   Need for influenza vaccination       Relevant Orders   Flu Vaccine QUAD High Dose(Fluad) (Completed)     -Check CBC with history of chronic mild thrombocytopenia is to assess stability. -We discussed prevention of full-fledged type 2 diabetes.  Recheck A1c.  Tighten up diet.  Also discussed the value of regular exercise -Recommend follow low saturated fat diet. -Flu vaccine given -Other vaccines up-to-date  No follow-ups on file.    Evelena Peat, MD

## 2023-08-10 DIAGNOSIS — X32XXXD Exposure to sunlight, subsequent encounter: Secondary | ICD-10-CM | POA: Diagnosis not present

## 2023-08-10 DIAGNOSIS — L570 Actinic keratosis: Secondary | ICD-10-CM | POA: Diagnosis not present

## 2023-10-14 ENCOUNTER — Ambulatory Visit (INDEPENDENT_AMBULATORY_CARE_PROVIDER_SITE_OTHER): Payer: Medicare Other | Admitting: Family Medicine

## 2023-10-14 ENCOUNTER — Encounter: Payer: Self-pay | Admitting: Family Medicine

## 2023-10-14 VITALS — BP 136/60 | HR 69 | Temp 97.7°F | Ht 69.29 in | Wt 191.7 lb

## 2023-10-14 DIAGNOSIS — R7303 Prediabetes: Secondary | ICD-10-CM | POA: Diagnosis not present

## 2023-10-14 DIAGNOSIS — Z125 Encounter for screening for malignant neoplasm of prostate: Secondary | ICD-10-CM

## 2023-10-14 DIAGNOSIS — Z0000 Encounter for general adult medical examination without abnormal findings: Secondary | ICD-10-CM | POA: Diagnosis not present

## 2023-10-14 DIAGNOSIS — E7800 Pure hypercholesterolemia, unspecified: Secondary | ICD-10-CM | POA: Diagnosis not present

## 2023-10-14 DIAGNOSIS — D696 Thrombocytopenia, unspecified: Secondary | ICD-10-CM

## 2023-10-14 DIAGNOSIS — Z23 Encounter for immunization: Secondary | ICD-10-CM

## 2023-10-14 DIAGNOSIS — Z1211 Encounter for screening for malignant neoplasm of colon: Secondary | ICD-10-CM

## 2023-10-14 LAB — CBC WITH DIFFERENTIAL/PLATELET
Basophils Absolute: 0 10*3/uL (ref 0.0–0.1)
Basophils Relative: 0.4 % (ref 0.0–3.0)
Eosinophils Absolute: 0.1 10*3/uL (ref 0.0–0.7)
Eosinophils Relative: 2.8 % (ref 0.0–5.0)
HCT: 44.8 % (ref 39.0–52.0)
Hemoglobin: 15 g/dL (ref 13.0–17.0)
Lymphocytes Relative: 34.9 % (ref 12.0–46.0)
Lymphs Abs: 1.7 10*3/uL (ref 0.7–4.0)
MCHC: 33.4 g/dL (ref 30.0–36.0)
MCV: 93.1 fL (ref 78.0–100.0)
Monocytes Absolute: 0.4 10*3/uL (ref 0.1–1.0)
Monocytes Relative: 8.1 % (ref 3.0–12.0)
Neutro Abs: 2.6 10*3/uL (ref 1.4–7.7)
Neutrophils Relative %: 53.8 % (ref 43.0–77.0)
Platelets: 158 10*3/uL (ref 150.0–400.0)
RBC: 4.81 Mil/uL (ref 4.22–5.81)
RDW: 13.5 % (ref 11.5–15.5)
WBC: 4.9 10*3/uL (ref 4.0–10.5)

## 2023-10-14 LAB — HEPATIC FUNCTION PANEL
ALT: 26 U/L (ref 0–53)
AST: 19 U/L (ref 0–37)
Albumin: 4.3 g/dL (ref 3.5–5.2)
Alkaline Phosphatase: 50 U/L (ref 39–117)
Bilirubin, Direct: 0.2 mg/dL (ref 0.0–0.3)
Total Bilirubin: 0.7 mg/dL (ref 0.2–1.2)
Total Protein: 6.4 g/dL (ref 6.0–8.3)

## 2023-10-14 LAB — BASIC METABOLIC PANEL
BUN: 16 mg/dL (ref 6–23)
CO2: 30 meq/L (ref 19–32)
Calcium: 9 mg/dL (ref 8.4–10.5)
Chloride: 106 meq/L (ref 96–112)
Creatinine, Ser: 0.96 mg/dL (ref 0.40–1.50)
GFR: 79.86 mL/min (ref >60.00)
Glucose, Bld: 132 mg/dL — ABNORMAL HIGH (ref 70–99)
Potassium: 4.7 meq/L (ref 3.5–5.1)
Sodium: 141 meq/L (ref 135–145)

## 2023-10-14 LAB — LIPID PANEL
Cholesterol: 208 mg/dL — ABNORMAL HIGH (ref 0–200)
HDL: 49.5 mg/dL (ref >39.00)
LDL Cholesterol: 135 mg/dL — ABNORMAL HIGH (ref 0–99)
NonHDL: 158.06
Total CHOL/HDL Ratio: 4
Triglycerides: 116 mg/dL (ref 0.0–149.0)
VLDL: 23.2 mg/dL (ref 0.0–40.0)

## 2023-10-14 LAB — PSA, MEDICARE: PSA: 0.85 ng/mL (ref 0.10–4.00)

## 2023-10-14 LAB — HEMOGLOBIN A1C: Hgb A1c MFr Bld: 6.1 % (ref 4.6–6.5)

## 2023-10-14 NOTE — Addendum Note (Signed)
Addended by: Christy Sartorius on: 10/14/2023 08:39 AM   Modules accepted: Orders

## 2023-10-14 NOTE — Progress Notes (Signed)
Established Patient Office Visit  Subjective   Patient ID: Harold White, male    DOB: 24-Mar-1951  Age: 72 y.o. MRN: [id number]  Chief Complaint  Patient presents with   Annual Exam    HPI   [name] is seen for yearly medical follow up.  He has history of hyperlipidemia, prediabetes, chronic mild thrombocytopenia.  Takes no regular medications.  He retired about 4 years ago.  Enjoying retirement.  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  Stays quite active.  Still sees dermatologist regularly.  Denies any polyuria or polydipsia.  A1c's have been fairly consistently low 6 range.  Health maintenance reviewed.  Needs flu vaccine.  Declined Shingrix vaccine.  No history of colonoscopy screening.  He agrees to [employer].  Pneumonia vaccine complete.  Family history reviewed no significant changes.  He has a younger sister is alive and well.  Father died age 51 what sounds like complications of sepsis following hip fracture.  Mom died age 61 pancreas cancer.  She had hypertension and stroke history.  No family history of premature CAD  Past Medical History:  Diagnosis Date   Chicken pox    Heart murmur    Hyperlipidemia    Hypertension    Kidney stones    Past Surgical History:  Procedure Laterality Date   TONSILLECTOMY     195    reports that he has never smoked. He has never used smokeless tobacco. He reports current alcohol use. He reports that he does not use drugs. family history includes Cancer (age of onset: 14) in his mother; Hypertension in his mother; Stroke in his mother. No Known Allergies  Review of Systems  Constitutional:  Negative for chills, fever and malaise/fatigue.  Eyes:  Negative for blurred vision.  Respiratory:  Negative for shortness of breath.   Cardiovascular:  Negative for chest pain.  Neurological:  Negative for dizziness, weakness and headaches.      Objective:     BP 136/60 (BP Location: Left Arm, Patient Position:  Sitting, Cuff Size: Normal)   Pulse 69   Temp 97.7 F (36.5 C) (Oral)   Ht 5' 9.29" (1.76 m)   Wt 191 lb 11.2 oz (87 kg)   SpO2 100%   BMI 28.07 kg/m  BP Readings from Last 3 Encounters:  10/14/23 136/60  10/11/22 136/60  10/06/21 140/60   Wt Readings from Last 3 Encounters:  10/14/23 191 lb 11.2 oz (87 kg)  10/11/22 191 lb 11.2 oz (87 kg)  10/06/21 192 lb 1.6 oz (87.1 kg)      Physical Exam Vitals reviewed.  Constitutional:      General: He is not in acute distress.    Appearance: He is well-developed. He is not ill-appearing.  HENT:     Head: Normocephalic and atraumatic.     Right Ear: External ear normal.     Left Ear: External ear normal.  Eyes:     Pupils: Pupils are equal, round, and reactive to light.  Neck:     Thyroid: No thyromegaly.  Cardiovascular:     Rate and Rhythm: Normal rate and regular rhythm.     Heart sounds: Normal heart sounds. No murmur heard. Pulmonary:     Effort: No respiratory distress.     Breath sounds: No wheezing or rales.  Abdominal:     General: Bowel sounds are normal. There is no distension.     Palpations: Abdomen is soft. There is no mass.  Tenderness: There is no abdominal tenderness. There is no guarding or rebound.  Musculoskeletal:     Cervical back: Normal range of motion and neck supple.  Lymphadenopathy:     Cervical: No cervical adenopathy.  Neurological:     General: No focal deficit present.     Mental Status: He is alert.      No results found for any visits on 10/14/23.  Last CBC Lab Results  Component Value Date   WBC 6.2 10/11/2022   HGB 15.3 10/11/2022   HCT 44.6 10/11/2022   MCV 91.2 10/11/2022   MCH 30.7 10/03/2020   RDW 13.6 10/11/2022   PLT 144.0 (L) 10/11/2022   Last metabolic panel Lab Results  Component Value Date   GLUCOSE 118 (H) 10/11/2022   NA 139 10/11/2022   K 4.2 10/11/2022   CL 104 10/11/2022   CO2 26 10/11/2022   BUN 16 10/11/2022   CREATININE 0.96 10/11/2022   GFR  80.43 10/11/2022   CALCIUM 9.0 10/11/2022   PROT 6.6 10/11/2022   ALBUMIN 4.4 10/11/2022   BILITOT 0.8 10/11/2022   ALKPHOS 46 10/11/2022   AST 16 10/11/2022   ALT 20 10/11/2022   Last lipids Lab Results  Component Value Date   CHOL 218 (H) 10/11/2022   HDL 57.20 10/11/2022   LDLCALC 144 (H) 10/11/2022   TRIG 81.0 10/11/2022   CHOLHDL 4 10/11/2022   Last hemoglobin A1c Lab Results  Component Value Date   HGBA1C 6.1 10/11/2022      The 10-year ASCVD risk score (Arnett DK, et al., 2019) is: 19.6%    Assessment & Plan:   Problem List Items Addressed This Visit       Unprioritized   Thrombocytopenia (HCC)   Relevant Orders   CBC with Differential/Platelet   Hypercholesteremia - Primary   Relevant Orders   Lipid panel   Hepatic function panel   Prediabetes   Relevant Orders   Basic metabolic panel   Hemoglobin A1c   Other Visit Diagnoses     Prostate cancer screening       Relevant Orders   PSA, Medicare   Colon cancer screening       Relevant Orders   Cologuard     -Set up labs as above -Discussed pros and cons of prostate cancer screening.  He would like to proceed. -Discussed colon cancer screening.  No history of colonoscopy.  He declines colonoscopy but does agree to [employer].  We did discuss limitations of Cologuard in terms of specificity and sensitivity -Flu vaccine given -Discussed Shingrix and he declines but will consider  No follow-ups on file.    Evelena Peat, MD

## 2023-11-14 ENCOUNTER — Encounter: Payer: Self-pay | Admitting: Family Medicine

## 2023-11-14 ENCOUNTER — Ambulatory Visit (INDEPENDENT_AMBULATORY_CARE_PROVIDER_SITE_OTHER): Payer: Medicare Other | Admitting: Family Medicine

## 2023-11-14 VITALS — BP 160/80 | HR 67 | Temp 97.6°F | Wt 192.3 lb

## 2023-11-14 DIAGNOSIS — J019 Acute sinusitis, unspecified: Secondary | ICD-10-CM | POA: Diagnosis not present

## 2023-11-14 DIAGNOSIS — R030 Elevated blood-pressure reading, without diagnosis of hypertension: Secondary | ICD-10-CM

## 2023-11-14 MED ORDER — AMOXICILLIN-POT CLAVULANATE 875-125 MG PO TABS: 1.0000 | ORAL_TABLET | Freq: Two times a day (BID) | ORAL | 0 refills | Status: DC

## 2023-11-14 NOTE — Patient Instructions (Signed)
Monitor blood pressure over next couple of weeks and be in touch if consistently > 140/90.

## 2023-11-14 NOTE — Progress Notes (Signed)
Established Patient Office Visit  Subjective   Patient ID: Harold White, male    DOB: 1951-09-13  Age: 72 y.o. MRN: [id number]  Chief Complaint  Patient presents with   Sinusitis    Patient complains of sinusitis, x12 days, Tried Mucinex and Aspirin   Cough    Patient complain of productive cough with brownish sputum, x12 days   Headache    Patient complains of headaches    HPI   Harold White is seen today with concern for acute sinusitis.  He has had symptoms now for about 2 full weeks.  He has a brownish nasal discharge along with productive cough.  Intermittent headaches.  No fever.  Has tried aspirin and Mucinex without much improvement.  Some mild hoarseness.  Denies any nausea or vomiting.  Has had sinusitis in the past.  No history of hypertension.  Blood pressure is up today but this is atypical for him.  He has a home blood pressure monitor but has not been monitoring recently.  Past Medical History:  Diagnosis Date   Chicken pox    Heart murmur    Hyperlipidemia    Hypertension    Kidney stones    Past Surgical History:  Procedure Laterality Date   TONSILLECTOMY     195    reports that he has never smoked. He has never used smokeless tobacco. He reports current alcohol use. He reports that he does not use drugs. family history includes Cancer (age of onset: 87) in his mother; Hypertension in his mother; Stroke in his mother. No Known Allergies  Review of Systems  Constitutional:  Negative for chills and fever.  HENT:  Positive for congestion and sinus pain.   Respiratory:  Positive for cough.   Cardiovascular:  Negative for chest pain.  Neurological:  Positive for headaches.      Objective:     BP (!) 160/80 (BP Location: Left Arm, Cuff Size: Normal)   Pulse 67   Temp 97.6 F (36.4 C) (Oral)   Wt 192 lb 4.8 oz (87.2 kg)   SpO2 98%   BMI 28.16 kg/m  BP Readings from Last 3 Encounters:  11/14/23 (!) 160/80  10/14/23 136/60  10/11/22 136/60   Wt  Readings from Last 3 Encounters:  11/14/23 192 lb 4.8 oz (87.2 kg)  10/14/23 191 lb 11.2 oz (87 kg)  10/11/22 191 lb 11.2 oz (87 kg)      Physical Exam Vitals reviewed.  Constitutional:      General: He is not in acute distress.    Appearance: He is not ill-appearing.  HENT:     Mouth/Throat:     Mouth: Mucous membranes are moist.     Pharynx: Oropharynx is clear.  Cardiovascular:     Rate and Rhythm: Normal rate and regular rhythm.  Pulmonary:     Effort: Pulmonary effort is normal.     Breath sounds: Normal breath sounds. No wheezing or rales.  Musculoskeletal:     Cervical back: Neck supple.  Neurological:     Mental Status: He is alert.      No results found for any visits on 11/14/23.    The 10-year ASCVD risk score (Arnett DK, et al., 2019) is: 26.2%    Assessment & Plan:   #1 acute sinusitis symptoms.  Almost 2-week history of symptoms as above.  He had bloody nasal discharge, intermittent headaches, facial pain.  Go ahead and start Augmentin 875 mg twice daily for 10 days.  Continue Mucinex and good hydration.  Follow-up immediately for any fever or worsening symptoms.  #2 elevated blood pressure without diagnosis of hypertension.  Recommend close home monitoring over next couple weeks and be in touch if consistently greater than 140/90  Evelena Peat, MD

## 2024-01-04 ENCOUNTER — Encounter: Payer: Self-pay | Admitting: Family Medicine

## 2024-01-04 DIAGNOSIS — Z1211 Encounter for screening for malignant neoplasm of colon: Secondary | ICD-10-CM

## 2024-01-10 DIAGNOSIS — Z1211 Encounter for screening for malignant neoplasm of colon: Secondary | ICD-10-CM | POA: Diagnosis not present

## 2024-01-14 LAB — COLOGUARD: COLOGUARD: NEGATIVE

## 2024-04-13 ENCOUNTER — Telehealth: Payer: Self-pay | Admitting: Family Medicine

## 2024-04-13 NOTE — Telephone Encounter (Signed)
 Patient provided number to billing

## 2024-04-13 NOTE — Telephone Encounter (Signed)
 Copied from CRM [id number]. Topic: General - Billing Inquiry >> Apr 13, 2024 11:38 AM Bambi Bonine D wrote: Reason for CRM: Pt stated that he was billed for $99 for a flu vaccine. Pt stated that he got the vaccine on 11/22 and the insurance paid for it but it was billed a second time and the insurance denied it because they already paid for the first one. Pt stated that he didn't receive a second flu vaccine and needs to have the removed to remove the bill. Pt stated his insurance stated that the word periodic also needs to be removed from the bill statement because its kicks out the billing.

## 2024-10-15 ENCOUNTER — Ambulatory Visit: Payer: Self-pay | Admitting: Family Medicine

## 2024-10-15 ENCOUNTER — Encounter: Payer: Self-pay | Admitting: Family Medicine

## 2024-10-15 ENCOUNTER — Ambulatory Visit (INDEPENDENT_AMBULATORY_CARE_PROVIDER_SITE_OTHER): Admitting: Family Medicine

## 2024-10-15 VITALS — BP 140/76 | HR 68 | Temp 97.8°F | Ht 69.0 in | Wt 196.2 lb

## 2024-10-15 DIAGNOSIS — Z125 Encounter for screening for malignant neoplasm of prostate: Secondary | ICD-10-CM | POA: Diagnosis not present

## 2024-10-15 DIAGNOSIS — R7303 Prediabetes: Secondary | ICD-10-CM

## 2024-10-15 DIAGNOSIS — Z23 Encounter for immunization: Secondary | ICD-10-CM | POA: Diagnosis not present

## 2024-10-15 DIAGNOSIS — D696 Thrombocytopenia, unspecified: Secondary | ICD-10-CM | POA: Diagnosis not present

## 2024-10-15 DIAGNOSIS — E7800 Pure hypercholesterolemia, unspecified: Secondary | ICD-10-CM

## 2024-10-15 LAB — COMPREHENSIVE METABOLIC PANEL WITH GFR
ALT: 21 U/L (ref 0–53)
AST: 19 U/L (ref 0–37)
Albumin: 4.5 g/dL (ref 3.5–5.2)
Alkaline Phosphatase: 51 U/L (ref 39–117)
BUN: 14 mg/dL (ref 6–23)
CO2: 28 meq/L (ref 19–32)
Calcium: 9 mg/dL (ref 8.4–10.5)
Chloride: 105 meq/L (ref 96–112)
Creatinine, Ser: 1.02 mg/dL (ref 0.40–1.50)
GFR: 73.74 mL/min (ref >60.00)
Glucose, Bld: 134 mg/dL — ABNORMAL HIGH (ref 70–99)
Potassium: 4.4 meq/L (ref 3.5–5.1)
Sodium: 140 meq/L (ref 135–145)
Total Bilirubin: 0.6 mg/dL (ref 0.2–1.2)
Total Protein: 6.6 g/dL (ref 6.0–8.3)

## 2024-10-15 LAB — CBC WITH DIFFERENTIAL/PLATELET
Basophils Absolute: 0 K/uL (ref 0.0–0.1)
Basophils Relative: 0.5 % (ref 0.0–3.0)
Eosinophils Absolute: 0.1 K/uL (ref 0.0–0.7)
Eosinophils Relative: 2.2 % (ref 0.0–5.0)
HCT: 43.9 % (ref 39.0–52.0)
Hemoglobin: 15 g/dL (ref 13.0–17.0)
Lymphocytes Relative: 34.2 % (ref 12.0–46.0)
Lymphs Abs: 2.1 K/uL (ref 0.7–4.0)
MCHC: 34.1 g/dL (ref 30.0–36.0)
MCV: 90.9 fl (ref 78.0–100.0)
Monocytes Absolute: 0.5 K/uL (ref 0.1–1.0)
Monocytes Relative: 8.3 % (ref 3.0–12.0)
Neutro Abs: 3.3 K/uL (ref 1.4–7.7)
Neutrophils Relative %: 54.8 % (ref 43.0–77.0)
Platelets: 152 K/uL (ref 150.0–400.0)
RBC: 4.83 Mil/uL (ref 4.22–5.81)
RDW: 13.6 % (ref 11.5–15.5)
WBC: 6 K/uL (ref 4.0–10.5)

## 2024-10-15 LAB — HEMOGLOBIN A1C: Hgb A1c MFr Bld: 5.8 % (ref 4.6–6.5)

## 2024-10-15 LAB — LIPID PANEL
Cholesterol: 199 mg/dL (ref 0–200)
HDL: 54.5 mg/dL (ref >39.00)
LDL Cholesterol: 131 mg/dL — ABNORMAL HIGH (ref 0–99)
NonHDL: 144.81
Total CHOL/HDL Ratio: 4
Triglycerides: 70 mg/dL (ref 0.0–149.0)
VLDL: 14 mg/dL (ref 0.0–40.0)

## 2024-10-15 LAB — PSA, MEDICARE: PSA: 0.57 ng/mL (ref 0.10–4.00)

## 2024-10-15 NOTE — Progress Notes (Signed)
 Established Patient Office Visit  Subjective   Patient ID: Harold White, male    DOB: [birth year]  Age: 73 y.o. MRN: [id number]  Chief Complaint  Patient presents with   Annual Exam    HPI   Mr. Skolnick is seen for annual medical follow-up.  Generally doing well.  He retired few years ago.  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  They go to about 10 show's per year.  He has history of hyperlipidemia, mild thrombocytopenia, prediabetes.  Takes no prescription medications.  He sees dermatologist annually and has history of actinic keratoses.  He has had history of borderline elevated blood pressures in the past but home readings are usually fairly consistently below 140 systolic.  Health maintenance reviewed.  He request flu vaccine today.  Pneumonia vaccine complete.  Last tetanus unknown.  No history of Shingrix.  Cologuard February 2025 negative.  Family history reviewed no significant changes. He has a younger sister is alive and well. Father died age 81 what sounds like complications of sepsis following hip fracture. Mom died age 62 pancreas cancer. She had hypertension and stroke history. No family history of premature CAD   Past Medical History:  Diagnosis Date   Chicken pox    Heart murmur    Hyperlipidemia    Hypertension    Kidney stones    Past Surgical History:  Procedure Laterality Date   TONSILLECTOMY     195    reports that he has never smoked. He has never used smokeless tobacco. He reports current alcohol use. He reports that he does not use drugs. family history includes Cancer (age of onset: 14) in his mother; Hypertension in his mother; Stroke in his mother. No Known Allergies  Review of Systems  Constitutional:  Negative for chills, fever, malaise/fatigue and weight loss.  HENT:  Negative for hearing loss.   Eyes:  Negative for blurred vision and double vision.  Respiratory:  Negative for cough and shortness of breath.    Cardiovascular:  Negative for chest pain, palpitations and leg swelling.  Gastrointestinal:  Negative for abdominal pain, blood in stool, constipation and diarrhea.  Genitourinary:  Negative for dysuria.  Skin:  Negative for rash.  Neurological:  Negative for dizziness, speech change, seizures, loss of consciousness and headaches.  Psychiatric/Behavioral:  Negative for depression.       Objective:     BP (!) 140/76   Pulse 68   Temp 97.8 F (36.6 C) (Oral)   Ht 5' 9 (1.753 m)   Wt 196 lb 3.2 oz (89 kg)   SpO2 95%   BMI 28.97 kg/m  BP Readings from Last 3 Encounters:  10/15/24 (!) 140/76  11/14/23 (!) 160/80  10/14/23 136/60   Wt Readings from Last 3 Encounters:  10/15/24 196 lb 3.2 oz (89 kg)  11/14/23 192 lb 4.8 oz (87.2 kg)  10/14/23 191 lb 11.2 oz (87 kg)      Physical Exam Vitals reviewed.  Constitutional:      General: He is not in acute distress.    Appearance: He is well-developed.  HENT:     Head: Normocephalic and atraumatic.     Right Ear: External ear normal.     Left Ear: External ear normal.  Eyes:     Conjunctiva/sclera: Conjunctivae normal.     Pupils: Pupils are equal, round, and reactive to light.  Neck:     Thyroid : No thyromegaly.  Cardiovascular:     Rate  and Rhythm: Normal rate and regular rhythm.     Heart sounds: Normal heart sounds. No murmur heard. Pulmonary:     Effort: No respiratory distress.     Breath sounds: No wheezing or rales.  Abdominal:     General: Bowel sounds are normal. There is no distension.     Palpations: Abdomen is soft. There is no mass.     Tenderness: There is no abdominal tenderness. There is no guarding or rebound.  Musculoskeletal:     Cervical back: Normal range of motion and neck supple.  Lymphadenopathy:     Cervical: No cervical adenopathy.  Skin:    Findings: No rash.     Comments: Thickened whitish hyperkeratotic lesion right forearm dorsally.  No ulceration.  Neurological:     Mental Status:  He is alert and oriented to person, place, and time.     Cranial Nerves: No cranial nerve deficit.      No results found for any visits on 10/15/24.    The 10-year ASCVD risk score (Arnett DK, et al., 2019) is: 22.7%    Assessment & Plan:   Problem List Items Addressed This Visit       Unprioritized   Thrombocytopenia   Relevant Orders   CBC with Differential/Platelet   Hypercholesteremia - Primary   Relevant Orders   Lipid panel   CMP   Prediabetes   Relevant Orders   Hemoglobin A1c   Other Visit Diagnoses       Prostate cancer screening       Relevant Orders   PSA, Medicare     Chronic stable medical problems as above.  We discussed the following health maintenance items:  -Flu vaccine given - Consider Shingrix vaccine at local pharmacy - Repeat Cologuard 2028 - Continue annual dermatology skin cancer screening - Obtain follow-up labs as above. - Continue lower glycemic diet with his history of prediabetes - Recommend periodic monitoring of home blood pressure and be in touch if consistently greater than 140/90  No follow-ups on file.    Wolm Scarlet, MD

## 2024-10-15 NOTE — Patient Instructions (Addendum)
 Consider Shingrix vaccine at some point this year.
